# Patient Record
Sex: Male | Born: 1965 | Race: Black or African American | Hispanic: No | Smoking: Former smoker
Health system: Southern US, Community
[De-identification: ages and names within clinical notes are randomized; demographics above are authoritative.]

## PROBLEM LIST (undated history)

## (undated) ENCOUNTER — Emergency Department (HOSPITAL_COMMUNITY): Admission: EM | Source: Home / Self Care

## (undated) DIAGNOSIS — F419 Anxiety disorder, unspecified: Secondary | ICD-10-CM

## (undated) DIAGNOSIS — E7800 Pure hypercholesterolemia, unspecified: Secondary | ICD-10-CM

## (undated) DIAGNOSIS — T7840XA Allergy, unspecified, initial encounter: Secondary | ICD-10-CM

## (undated) DIAGNOSIS — F1910 Other psychoactive substance abuse, uncomplicated: Secondary | ICD-10-CM

## (undated) DIAGNOSIS — T300 Burn of unspecified body region, unspecified degree: Secondary | ICD-10-CM

## (undated) DIAGNOSIS — I10 Essential (primary) hypertension: Secondary | ICD-10-CM

## (undated) DIAGNOSIS — G4730 Sleep apnea, unspecified: Secondary | ICD-10-CM

## (undated) DIAGNOSIS — S069XAA Unspecified intracranial injury with loss of consciousness status unknown, initial encounter: Secondary | ICD-10-CM

## (undated) DIAGNOSIS — K219 Gastro-esophageal reflux disease without esophagitis: Secondary | ICD-10-CM

## (undated) DIAGNOSIS — R569 Unspecified convulsions: Secondary | ICD-10-CM

## (undated) HISTORY — DX: Sleep apnea, unspecified: G47.30

## (undated) HISTORY — DX: Allergy, unspecified, initial encounter: T78.40XA

## (undated) HISTORY — DX: Gastro-esophageal reflux disease without esophagitis: K21.9

## (undated) HISTORY — DX: Anxiety disorder, unspecified: F41.9

## (undated) HISTORY — PX: COLONOSCOPY: SHX174

## (undated) HISTORY — PX: OTHER SURGICAL HISTORY: SHX169

## (undated) HISTORY — DX: Other psychoactive substance abuse, uncomplicated: F19.10

---

## 2011-11-03 ENCOUNTER — Emergency Department (HOSPITAL_BASED_OUTPATIENT_CLINIC_OR_DEPARTMENT_OTHER)
Admission: EM | Admit: 2011-11-03 | Discharge: 2011-11-03 | Disposition: A | Discharge status: Home / Self Care | Payer: Self-pay | Attending: Emergency Medicine | Admitting: Emergency Medicine

## 2011-11-03 ENCOUNTER — Encounter (HOSPITAL_BASED_OUTPATIENT_CLINIC_OR_DEPARTMENT_OTHER): Payer: Self-pay

## 2011-11-03 DIAGNOSIS — X503XXA Overexertion from repetitive movements, initial encounter: Secondary | ICD-10-CM | POA: Insufficient documentation

## 2011-11-03 DIAGNOSIS — M545 Low back pain, unspecified: Secondary | ICD-10-CM | POA: Insufficient documentation

## 2011-11-03 DIAGNOSIS — S39012A Strain of muscle, fascia and tendon of lower back, initial encounter: Secondary | ICD-10-CM

## 2011-11-03 DIAGNOSIS — S335XXA Sprain of ligaments of lumbar spine, initial encounter: Secondary | ICD-10-CM | POA: Insufficient documentation

## 2011-11-03 HISTORY — DX: Burn of unspecified body region, unspecified degree: T30.0

## 2011-11-03 MED ORDER — KETOROLAC TROMETHAMINE 60 MG/2ML IM SOLN
60.0000 mg | Freq: Once | INTRAMUSCULAR | Status: AC
  Administered 2011-11-03: 60 mg via INTRAMUSCULAR
  Filled 2011-11-03: qty 2

## 2011-11-03 MED ORDER — IBUPROFEN 800 MG PO TABS: 800.0000 mg | ORAL_TABLET | Freq: Three times a day (TID) | ORAL | Status: AC

## 2011-11-03 MED ORDER — OXYCODONE-ACETAMINOPHEN 5-325 MG PO TABS: 2.0000 | ORAL_TABLET | ORAL | Status: AC | PRN

## 2011-11-03 MED ORDER — OXYCODONE-ACETAMINOPHEN 5-325 MG PO TABS
1.0000 | ORAL_TABLET | Freq: Once | ORAL | Status: AC
  Administered 2011-11-03: 1 via ORAL
  Filled 2011-11-03: qty 1

## 2011-11-03 MED ORDER — DIAZEPAM 5 MG PO TABS: 5.0000 mg | ORAL_TABLET | Freq: Two times a day (BID) | ORAL | Status: AC
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
## 2011-11-03 NOTE — ED Notes (Signed)
Pt states that he has back pain that extends across his back.  Pt states that he has been taking muscle relaxers and vicodin at home with no relief.  No sensation or circ compromise.

## 2011-11-04 NOTE — ED Provider Notes (Signed)
Medical screening examination/treatment/procedure(s) were performed by non-physician practitioner and as supervising physician I was immediately available for consultation/collaboration.   Gwyneth Sprout, MD 11/04/11 [phone]

## 2013-03-22 ENCOUNTER — Encounter (HOSPITAL_BASED_OUTPATIENT_CLINIC_OR_DEPARTMENT_OTHER): Payer: Self-pay | Admitting: *Deleted

## 2013-03-22 ENCOUNTER — Emergency Department (HOSPITAL_BASED_OUTPATIENT_CLINIC_OR_DEPARTMENT_OTHER)
Admission: EM | Admit: 2013-03-22 | Discharge: 2013-03-22 | Disposition: A | Discharge status: Home / Self Care | Payer: Self-pay | Attending: Emergency Medicine | Admitting: Emergency Medicine

## 2013-03-22 DIAGNOSIS — Z87891 Personal history of nicotine dependence: Secondary | ICD-10-CM | POA: Insufficient documentation

## 2013-03-22 DIAGNOSIS — L509 Urticaria, unspecified: Secondary | ICD-10-CM | POA: Insufficient documentation

## 2013-03-22 NOTE — ED Notes (Signed)
np at bedside
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
## 2013-03-22 NOTE — ED Notes (Signed)
Pt c/o rash to entire body x 1 week 

## 2013-03-22 NOTE — ED Provider Notes (Signed)
Medical screening examination/treatment/procedure(s) were performed by non-physician practitioner and as supervising physician I was immediately available for consultation/collaboration.   Jarone Ostergaard, MD 03/22/13 1935 

## 2013-06-05 ENCOUNTER — Encounter (HOSPITAL_BASED_OUTPATIENT_CLINIC_OR_DEPARTMENT_OTHER): Payer: Self-pay | Admitting: Emergency Medicine

## 2013-06-05 ENCOUNTER — Emergency Department (HOSPITAL_BASED_OUTPATIENT_CLINIC_OR_DEPARTMENT_OTHER)
Admission: EM | Admit: 2013-06-05 | Discharge: 2013-06-05 | Disposition: A | Discharge status: Home / Self Care | Payer: Self-pay | Attending: Emergency Medicine | Admitting: Emergency Medicine

## 2013-06-05 DIAGNOSIS — Z87828 Personal history of other (healed) physical injury and trauma: Secondary | ICD-10-CM | POA: Insufficient documentation

## 2013-06-05 DIAGNOSIS — Z87891 Personal history of nicotine dependence: Secondary | ICD-10-CM | POA: Insufficient documentation

## 2013-06-05 DIAGNOSIS — J029 Acute pharyngitis, unspecified: Secondary | ICD-10-CM | POA: Insufficient documentation

## 2013-06-05 NOTE — ED Notes (Signed)
Nasal congestion, sore throat and difficulty breathing since this am.  No fever.  No respiratory distress noted.
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
## 2016-02-25 ENCOUNTER — Emergency Department (HOSPITAL_BASED_OUTPATIENT_CLINIC_OR_DEPARTMENT_OTHER)
Admission: EM | Admit: 2016-02-25 | Discharge: 2016-02-25 | Disposition: A | Discharge status: Home / Self Care | Payer: PRIVATE HEALTH INSURANCE | Attending: Emergency Medicine | Admitting: Emergency Medicine

## 2016-02-25 ENCOUNTER — Encounter (HOSPITAL_BASED_OUTPATIENT_CLINIC_OR_DEPARTMENT_OTHER): Payer: Self-pay | Admitting: Emergency Medicine

## 2016-02-25 DIAGNOSIS — Z79899 Other long term (current) drug therapy: Secondary | ICD-10-CM | POA: Diagnosis not present

## 2016-02-25 DIAGNOSIS — H6123 Impacted cerumen, bilateral: Secondary | ICD-10-CM | POA: Insufficient documentation

## 2016-02-25 DIAGNOSIS — Z87891 Personal history of nicotine dependence: Secondary | ICD-10-CM | POA: Insufficient documentation

## 2016-02-25 DIAGNOSIS — H938X3 Other specified disorders of ear, bilateral: Secondary | ICD-10-CM | POA: Diagnosis present

## 2016-02-25 HISTORY — DX: Unspecified convulsions: R56.9

## 2016-02-25 NOTE — Discharge Instructions (Signed)
Follow up with your primary care provider in regard's to today's visit. See attached resource guide for help finding a primary physician. Return to ER for new or worsening symptoms, any additional concerns.
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
## 2016-02-25 NOTE — ED Triage Notes (Signed)
Patient states that his right ear has been "stopped up" x 5 months. Reports that now his left ear is feeling the same

## 2016-03-13 ENCOUNTER — Other Ambulatory Visit: Payer: Self-pay | Admitting: Occupational Medicine

## 2016-03-13 ENCOUNTER — Ambulatory Visit: Payer: Self-pay

## 2016-03-13 DIAGNOSIS — Z0000 Encounter for general adult medical examination without abnormal findings: Secondary | ICD-10-CM

## 2016-04-25 ENCOUNTER — Encounter (HOSPITAL_BASED_OUTPATIENT_CLINIC_OR_DEPARTMENT_OTHER): Payer: Self-pay | Admitting: *Deleted

## 2016-04-25 ENCOUNTER — Emergency Department (HOSPITAL_BASED_OUTPATIENT_CLINIC_OR_DEPARTMENT_OTHER)
Admission: EM | Admit: 2016-04-25 | Discharge: 2016-04-25 | Disposition: A | Discharge status: Home / Self Care | Payer: PRIVATE HEALTH INSURANCE | Attending: Emergency Medicine | Admitting: Emergency Medicine

## 2016-04-25 DIAGNOSIS — Z87891 Personal history of nicotine dependence: Secondary | ICD-10-CM | POA: Insufficient documentation

## 2016-04-25 DIAGNOSIS — R197 Diarrhea, unspecified: Secondary | ICD-10-CM | POA: Insufficient documentation

## 2016-04-25 LAB — COMPREHENSIVE METABOLIC PANEL
ALT: 35 U/L (ref 17–63)
ANION GAP: 6 (ref 5–15)
AST: 31 U/L (ref 15–41)
Albumin: 4.2 g/dL (ref 3.5–5.0)
Alkaline Phosphatase: 45 U/L (ref 38–126)
BUN: 14 mg/dL (ref 6–20)
CHLORIDE: 103 mmol/L (ref 101–111)
CO2: 29 mmol/L (ref 22–32)
Calcium: 9.7 mg/dL (ref 8.9–10.3)
Creatinine, Ser: 1.09 mg/dL (ref 0.61–1.24)
GFR calc non Af Amer: >60 mL/min (ref >60)
Glucose, Bld: 124 mg/dL — ABNORMAL HIGH (ref 65–99)
POTASSIUM: 3.5 mmol/L (ref 3.5–5.1)
SODIUM: 138 mmol/L (ref 135–145)
Total Bilirubin: 0.6 mg/dL (ref 0.3–1.2)
Total Protein: 7.3 g/dL (ref 6.5–8.1)

## 2016-04-25 LAB — LIPASE, BLOOD: LIPASE: 21 U/L (ref 11–51)

## 2016-04-25 LAB — CBC WITH DIFFERENTIAL/PLATELET
BASOS ABS: 0 10*3/uL (ref 0.0–0.1)
Basophils Relative: 0 %
Eosinophils Absolute: 0.1 10*3/uL (ref 0.0–0.7)
Eosinophils Relative: 2 %
HCT: 45.2 % (ref 39.0–52.0)
HEMOGLOBIN: 15.5 g/dL (ref 13.0–17.0)
LYMPHS ABS: 2.7 10*3/uL (ref 0.7–4.0)
LYMPHS PCT: 43 %
MCH: 27.5 pg (ref 26.0–34.0)
MCHC: 34.3 g/dL (ref 30.0–36.0)
MCV: 80.1 fL (ref 78.0–100.0)
Monocytes Absolute: 0.5 10*3/uL (ref 0.1–1.0)
Monocytes Relative: 8 %
NEUTROS PCT: 47 %
Neutro Abs: 2.9 10*3/uL (ref 1.7–7.7)
Platelets: 283 10*3/uL (ref 150–400)
RBC: 5.64 MIL/uL (ref 4.22–5.81)
RDW: 14.2 % (ref 11.5–15.5)
WBC: 6.2 10*3/uL (ref 4.0–10.5)

## 2016-04-25 LAB — MAGNESIUM: MAGNESIUM: 1.8 mg/dL (ref 1.7–2.4)

## 2016-04-25 MED ORDER — LOPERAMIDE HCL 2 MG PO CAPS
4.0000 mg | ORAL_CAPSULE | Freq: Once | ORAL | Status: AC
  Administered 2016-04-25: 4 mg via ORAL
  Filled 2016-04-25: qty 2

## 2016-04-25 MED ORDER — DICYCLOMINE HCL 10 MG PO CAPS
10.0000 mg | ORAL_CAPSULE | Freq: Once | ORAL | Status: AC
  Administered 2016-04-25: 10 mg via ORAL
  Filled 2016-04-25: qty 1

## 2016-04-25 MED ORDER — DICYCLOMINE HCL 10 MG PO CAPS: 20.0000 mg | ORAL_CAPSULE | Freq: Four times a day (QID) | ORAL | 0 refills | Status: DC | PRN

## 2016-04-25 MED ORDER — LOPERAMIDE HCL 2 MG PO CAPS: 2.0000 mg | ORAL_CAPSULE | Freq: Four times a day (QID) | ORAL | 0 refills | Status: DC | PRN

## 2016-04-25 MED FILL — DICYCLOMINE 20 MG TABLET: 20 | 2 days supply | Qty: 10 | Fill #0

## 2016-04-25 MED FILL — SM ANTI-DIARRHEAL 2 MG CAPL: 2 | 12 days supply | Qty: 48 | Fill #0

## 2016-04-25 NOTE — ED Triage Notes (Signed)
Pt c/o diarrhea only x 2 days
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
## 2016-04-25 NOTE — Discharge Instructions (Signed)
Please follow with your primary care doctor in the next 2 days for a check-up. They must obtain records for further management.  ° °Do not hesitate to return to the Emergency Department for any new, worsening or concerning symptoms.  ° °

## 2016-07-08 DIAGNOSIS — N529 Male erectile dysfunction, unspecified: Secondary | ICD-10-CM | POA: Insufficient documentation

## 2016-09-29 ENCOUNTER — Emergency Department (HOSPITAL_BASED_OUTPATIENT_CLINIC_OR_DEPARTMENT_OTHER): Payer: Managed Care, Other (non HMO)

## 2016-09-29 ENCOUNTER — Encounter (HOSPITAL_BASED_OUTPATIENT_CLINIC_OR_DEPARTMENT_OTHER): Payer: Self-pay | Admitting: Emergency Medicine

## 2016-09-29 ENCOUNTER — Emergency Department (HOSPITAL_BASED_OUTPATIENT_CLINIC_OR_DEPARTMENT_OTHER)
Admission: EM | Admit: 2016-09-29 | Discharge: 2016-09-29 | Disposition: A | Discharge status: Home / Self Care | Payer: Managed Care, Other (non HMO) | Attending: Emergency Medicine | Admitting: Emergency Medicine

## 2016-09-29 DIAGNOSIS — Y929 Unspecified place or not applicable: Secondary | ICD-10-CM | POA: Insufficient documentation

## 2016-09-29 DIAGNOSIS — S29011A Strain of muscle and tendon of front wall of thorax, initial encounter: Secondary | ICD-10-CM | POA: Diagnosis not present

## 2016-09-29 DIAGNOSIS — Z87891 Personal history of nicotine dependence: Secondary | ICD-10-CM | POA: Diagnosis not present

## 2016-09-29 DIAGNOSIS — R0789 Other chest pain: Secondary | ICD-10-CM

## 2016-09-29 DIAGNOSIS — S299XXA Unspecified injury of thorax, initial encounter: Secondary | ICD-10-CM | POA: Diagnosis present

## 2016-09-29 DIAGNOSIS — X501XXA Overexertion from prolonged static or awkward postures, initial encounter: Secondary | ICD-10-CM | POA: Diagnosis not present

## 2016-09-29 DIAGNOSIS — Y999 Unspecified external cause status: Secondary | ICD-10-CM | POA: Insufficient documentation

## 2016-09-29 DIAGNOSIS — I10 Essential (primary) hypertension: Secondary | ICD-10-CM | POA: Insufficient documentation

## 2016-09-29 DIAGNOSIS — Z79899 Other long term (current) drug therapy: Secondary | ICD-10-CM | POA: Insufficient documentation

## 2016-09-29 DIAGNOSIS — Y9389 Activity, other specified: Secondary | ICD-10-CM | POA: Diagnosis not present

## 2016-09-29 HISTORY — DX: Essential (primary) hypertension: I10

## 2016-09-29 HISTORY — DX: Pure hypercholesterolemia, unspecified: E78.00

## 2016-09-29 LAB — CBC
HCT: 42.6 % (ref 39.0–52.0)
Hemoglobin: 14.7 g/dL (ref 13.0–17.0)
MCH: 27.1 pg (ref 26.0–34.0)
MCHC: 34.5 g/dL (ref 30.0–36.0)
MCV: 78.6 fL (ref 78.0–100.0)
PLATELETS: 325 10*3/uL (ref 150–400)
RBC: 5.42 MIL/uL (ref 4.22–5.81)
RDW: 13.6 % (ref 11.5–15.5)
WBC: 5.3 10*3/uL (ref 4.0–10.5)

## 2016-09-29 LAB — COMPREHENSIVE METABOLIC PANEL
ALBUMIN: 3.9 g/dL (ref 3.5–5.0)
ALT: 22 U/L (ref 17–63)
AST: 18 U/L (ref 15–41)
Alkaline Phosphatase: 54 U/L (ref 38–126)
Anion gap: 6 (ref 5–15)
BUN: 12 mg/dL (ref 6–20)
CHLORIDE: 104 mmol/L (ref 101–111)
CO2: 28 mmol/L (ref 22–32)
CREATININE: 0.82 mg/dL (ref 0.61–1.24)
Calcium: 9.1 mg/dL (ref 8.9–10.3)
GFR calc Af Amer: >60 mL/min (ref >60)
GLUCOSE: 84 mg/dL (ref 65–99)
POTASSIUM: 3.8 mmol/L (ref 3.5–5.1)
SODIUM: 138 mmol/L (ref 135–145)
Total Bilirubin: 0.6 mg/dL (ref 0.3–1.2)
Total Protein: 7.3 g/dL (ref 6.5–8.1)

## 2016-09-29 LAB — TROPONIN I

## 2016-09-29 MED ORDER — IBUPROFEN 800 MG PO TABS: 800.0000 mg | ORAL_TABLET | Freq: Four times a day (QID) | ORAL | 0 refills | Status: DC | PRN

## 2016-09-29 NOTE — ED Notes (Signed)
Pt ambulatory without assistance.  

## 2016-09-29 NOTE — ED Notes (Signed)
Pt on cardiac monitor and automatic VS 

## 2016-09-29 NOTE — ED Triage Notes (Signed)
Chest pain since Friday, non radiating, denies N/V, SOB. Describes it as a raw feeling.

## 2016-09-29 NOTE — ED Provider Notes (Signed)
[facility] Provider Note   CSN: [id number] Arrival date & time: 09/29/16  1441   By signing my name below, I, Soijett Blue, attest that this documentation has been prepared under the direction and in the presence of Leo Grosser, MD. Electronically Signed: Soijett Blue, ED Scribe. 09/29/16. 3:52 PM.  History   Chief Complaint Chief Complaint  Patient presents with  . Chest Pain    HPI Timothy Fuller is a 51 y.o. male with a PMHx of HTN, high cholesterol, who presents to the Emergency Department complaining of intermittent, non-radiating, sharp, left sided CP onset 2 days ago. He reports that certain movements, ambulation, and exertion exacerbate his left sided CP. Pt notes that he hasn't had an increase in his activity levels prior to the onset of his symptoms. Pt notes that he had a cousin to have a MI before age 52, but denies immediate family members to have a MI before age 37. He denies SOB, diaphoresis, nausea, and any other symptoms. Denies PMHx of DM or cardiac issues.    The history is provided by the patient. No language interpreter was used.    Past Medical History:  Diagnosis Date  . Burn   . High cholesterol   . Hypertension   . Seizures ([place])     There are no active problems to display for this patient.   Past Surgical History:  Procedure Laterality Date  . SKIN GRAFT         Home Medications    Prior to Admission medications   Medication Sig Start Date End Date Taking? Authorizing Provider  amLODipine (NORVASC) 5 MG tablet Take 5 mg by mouth daily.   Yes Historical Provider, MD  hydrOXYzine (ATARAX/VISTARIL) 25 MG tablet Take 25 mg by mouth 3 (three) times daily as needed.   Yes Historical Provider, MD  sertraline (ZOLOFT) 25 MG tablet Take 25 mg by mouth daily.   Yes Historical Provider, MD  dicyclomine (BENTYL) 10 MG capsule Take 2 capsules (20 mg total) by mouth 4 (four) times daily as needed for spasms. 04/25/16   Nicole Pisciotta, PA-C    loperamide (IMODIUM) 2 MG capsule Take 1 capsule (2 mg total) by mouth 4 (four) times daily as needed for diarrhea or loose stools. 04/25/16   Elmyra Ricks Pisciotta, PA-C    Family History No family history on file.  Social History Social History  Substance Use Topics  . Smoking status: Former Smoker    Packs/day: 1.00  . Smokeless tobacco: Never Used  . Alcohol use No     Allergies   Patient has no known allergies.   Review of Systems Review of Systems  All other systems reviewed and are negative.    Physical Exam Updated Vital Signs BP 119/75 (BP Location: Left Arm)   Pulse 69   Temp 98.2 F (36.8 C) (Oral)   Resp 16   Ht 6\' 1"  (1.854 m)   Wt 217 lb (98.4 kg)   SpO2 99%   BMI 28.63 kg/m   Physical Exam  Constitutional: He is oriented to person, place, and time. He appears well-developed and well-nourished. No distress.  HENT:  Head: Normocephalic and atraumatic.  Nose: Nose normal.  Eyes: Conjunctivae are normal.  Neck: Neck supple. No tracheal deviation present.  Cardiovascular: Normal rate, regular rhythm and normal heart sounds.  Exam reveals no gallop and no friction rub.   No murmur heard. Pulmonary/Chest: Effort normal and breath sounds normal. No respiratory distress. He has no wheezes.  He has no rales.  Left pectoralis tenderness with left arm flexion.   Abdominal: Soft. He exhibits no distension.  Neurological: He is alert and oriented to person, place, and time.  Skin: Skin is warm and dry.  Psychiatric: He has a normal mood and affect.  Nursing note and vitals reviewed.    ED Treatments / Results  DIAGNOSTIC STUDIES: Oxygen Saturation is 99% on RA, nl by my interpretation.    COORDINATION OF CARE: 3:51 PM Discussed treatment plan with pt at bedside which includes labs, EKG, CXR, and pt agreed to plan.   Labs (all labs ordered are listed, but only abnormal results are displayed) Labs Reviewed  CBC  TROPONIN I  COMPREHENSIVE METABOLIC  PANEL    EKG  EKG Interpretation  Date/Time:  Sunday September 29 2016 14:46:24 EDT Ventricular Rate:  70 PR Interval:  190 QRS Duration: 94 QT Interval:  378 QTC Calculation: 408 R Axis:   79 Text Interpretation:  Normal sinus rhythm ST elevation, consider early repolarization Borderline ECG Confirmed by Rees, Liz ([id number]) on 09/29/2016 2:48:36 PM       Radiology No results found.  Procedures Procedures (including critical care time)  Medications Ordered in ED Medications - No data to display   Initial Impression / Assessment and Plan / ED Course  I have reviewed the triage vital signs and the nursing notes.  Pertinent labs & imaging results that were available during my care of the patient were reviewed by me and considered in my medical decision making (see chart for details).     50  year old male presents with highly atypical left-sided chest pain is reproducible with certain movements and palpation over the left anterior chest. He has taken nothing for the symptoms. He is well-appearing and has a few risk factors of hypertension, hyperlipidemia and some early repolarization changes which are likely normal variant given his demographics. Heart score is 3. Troponin is negative despite 2 days of ongoing intermittent pain. Patient was recommended to take short course of scheduled NSAIDs and engage in early mobility as definitive treatment.   Final Clinical Impressions(s) / ED Diagnoses   Final diagnoses:  Strain of left pectoralis muscle, initial encounter  Chest wall pain    New Prescriptions New Prescriptions   IBUPROFEN (ADVIL,MOTRIN) 800 MG TABLET    Take 1 tablet (800 mg total) by mouth every 6 (six) hours as needed for moderate pain.   I personally performed the services described in this documentation, which was scribed in my presence. The recorded information has been reviewed and is accurate.     Leo Grosser, MD 09/29/16 [phone]

## 2017-01-09 DIAGNOSIS — F419 Anxiety disorder, unspecified: Secondary | ICD-10-CM | POA: Insufficient documentation

## 2017-01-09 DIAGNOSIS — F32A Depression, unspecified: Secondary | ICD-10-CM | POA: Insufficient documentation

## 2017-02-09 ENCOUNTER — Encounter (HOSPITAL_BASED_OUTPATIENT_CLINIC_OR_DEPARTMENT_OTHER): Payer: Self-pay | Admitting: *Deleted

## 2017-02-09 ENCOUNTER — Emergency Department (HOSPITAL_BASED_OUTPATIENT_CLINIC_OR_DEPARTMENT_OTHER): Payer: Managed Care, Other (non HMO)

## 2017-02-09 ENCOUNTER — Emergency Department (HOSPITAL_BASED_OUTPATIENT_CLINIC_OR_DEPARTMENT_OTHER)
Admission: EM | Admit: 2017-02-09 | Discharge: 2017-02-09 | Disposition: A | Discharge status: Home / Self Care | Payer: Managed Care, Other (non HMO) | Attending: Emergency Medicine | Admitting: Emergency Medicine

## 2017-02-09 DIAGNOSIS — Z79899 Other long term (current) drug therapy: Secondary | ICD-10-CM | POA: Diagnosis not present

## 2017-02-09 DIAGNOSIS — S63502A Unspecified sprain of left wrist, initial encounter: Secondary | ICD-10-CM

## 2017-02-09 DIAGNOSIS — Z87891 Personal history of nicotine dependence: Secondary | ICD-10-CM | POA: Diagnosis not present

## 2017-02-09 DIAGNOSIS — Y999 Unspecified external cause status: Secondary | ICD-10-CM | POA: Diagnosis not present

## 2017-02-09 DIAGNOSIS — Y939 Activity, unspecified: Secondary | ICD-10-CM | POA: Insufficient documentation

## 2017-02-09 DIAGNOSIS — S6992XA Unspecified injury of left wrist, hand and finger(s), initial encounter: Secondary | ICD-10-CM | POA: Diagnosis present

## 2017-02-09 DIAGNOSIS — I10 Essential (primary) hypertension: Secondary | ICD-10-CM | POA: Insufficient documentation

## 2017-02-09 DIAGNOSIS — S63095A Other dislocation of left wrist and hand, initial encounter: Secondary | ICD-10-CM

## 2017-02-09 DIAGNOSIS — Y929 Unspecified place or not applicable: Secondary | ICD-10-CM | POA: Diagnosis not present

## 2017-02-09 DIAGNOSIS — W19XXXA Unspecified fall, initial encounter: Secondary | ICD-10-CM | POA: Diagnosis not present

## 2017-02-09 NOTE — ED Notes (Signed)
Patient transported to X-ray 

## 2017-02-09 NOTE — ED Triage Notes (Signed)
Pt reports yesterday he tripped and broke his fall with both hands. Presents today with L wrist pain. Denies numbness/tingling in fingers.

## 2017-02-09 NOTE — Discharge Instructions (Signed)

## 2017-02-09 NOTE — ED Provider Notes (Signed)
[facility] Provider Note   CSN: [id number] Arrival date & time: 02/09/17  0865     History   Chief Complaint Chief Complaint  Patient presents with  . Wrist Injury    HPI Timothy Fuller is a 51 y.o. male who presents with left wrist pain after he suffered a mechanical fall yesterday and broke his fall with his hands (Timothy Fuller mechanism).   He denies any numbness or tingling, no pain in right upper extremity, left elbow/shoulder. Did not strike his head, did not pass out.  HPI  Past Medical History:  Diagnosis Date  . Burn   . High cholesterol   . Hypertension   . Seizures (Timothy Fuller)     There are no active problems to display for this patient.   Past Surgical History:  Procedure Laterality Date  . SKIN GRAFT         Home Medications    Prior to Admission medications   Medication Sig Start Date End Date Taking? Authorizing Provider  ALPRAZolam (XANAX PO) Take by mouth.   Yes [provider]  amLODipine (NORVASC) 5 MG tablet Take 5 mg by mouth daily.   Yes [provider]  ibuprofen (ADVIL,MOTRIN) 800 MG tablet Take 1 tablet (800 mg total) by mouth every 6 (six) hours as needed for moderate pain. 09/29/16  Yes Timothy Grosser, MD  dicyclomine (BENTYL) 10 MG capsule Take 2 capsules (20 mg total) by mouth 4 (four) times daily as needed for spasms. 04/25/16   Fuller, Timothy Ricks, PA-C  hydrOXYzine (ATARAX/VISTARIL) 25 MG tablet Take 25 mg by mouth 3 (three) times daily as needed.    [provider]  loperamide (IMODIUM) 2 MG capsule Take 1 capsule (2 mg total) by mouth 4 (four) times daily as needed for diarrhea or loose stools. 04/25/16   Fuller, Timothy Ricks, PA-C  sertraline (ZOLOFT) 25 MG tablet Take 25 mg by mouth daily.    [provider]    Family History No family history on file.  Social History Social History  Substance Use Topics  . Smoking status: Former Smoker    Packs/day: 1.00  . Smokeless tobacco: Never Used  .  Alcohol use No     Allergies   Patient has no known allergies.   Review of Systems Review of Systems  Musculoskeletal: Positive for arthralgias. Negative for joint swelling and myalgias.  Neurological: Negative for numbness and headaches.     Physical Exam Updated Vital Signs BP (!) 145/82 (BP Location: Right Arm)   Pulse 70   Temp 97.8 F (36.6 C) (Oral)   Resp 14   Ht 6\' 1"  (1.854 m)   Wt 103.4 kg (228 lb)   SpO2 97%   BMI 30.08 kg/m   Physical Exam  Constitutional: He appears well-developed and well-nourished.  HENT:  Head: Normocephalic and atraumatic.  Cardiovascular: Intact distal pulses.   Musculoskeletal:  LUE: Full active range of motion, however is painful. Grip strength and sensation are intact. Good cap refill to fingers. No scaphoid/anatomic snuffbox tenderness.  Neurological: No sensory deficit.  Skin: Skin is warm and dry. He is not diaphoretic.  Psychiatric: He has a normal mood and affect. His behavior is normal.  Nursing note and vitals reviewed.    ED Treatments / Results  Labs (all labs ordered are listed, but only abnormal results are displayed) Labs Reviewed - No data to display  EKG  EKG Interpretation None       Radiology Dg Wrist Complete Left  Result Date: 02/09/2017 CLINICAL DATA:  Fall yesterday with wrist pain, initial encounter EXAM: LEFT WRIST - COMPLETE 3+ VIEW COMPARISON:  None. FINDINGS: Mild widening of the scapholunate space is noted which may be related to ligamentous injury. No acute fracture or dislocation is seen. No soft tissue abnormality is noted. IMPRESSION: Mild widening of the scapholunate space which may be related to ligamentous injury. No acute bony abnormality is seen. Electronically Signed   By: Timothy Fuller M.D.   On: 02/09/2017 09:16    Procedures Procedures (including critical care time)  Medications Ordered in ED Medications - No data to display   Initial Impression / Assessment and Plan / ED  Course  I have reviewed the triage vital signs and the nursing notes.  Pertinent labs & imaging results that were available during my care of the patient were reviewed by me and considered in my medical decision making (see chart for details).    Timothy Fuller Presents with left wrist  pain after a mechanical fall with a [place] type injury that occurred yesterday consistent with a wrist sprain/strain.  The affected wrist has mild edema and is tender on the posterior aspect.  X-rays were obtained with mild scapholunate widening, possibly suggestive of ligamentous injury.  The skin is intact to wrist/hand.  The hand is warm and well perfused with intact sensation.  Motor function is limited secondary to pain.  Patient given instructions for OTC pain medication.  Patient advised to follow up with hand surgery for additional evaluation. He was given a removable wrist splint.  Patient was given the option to ask questions, all of which were answered to the best of my ability.  Patient is agreeable for discharge.     Final Clinical Impressions(s) / ED Diagnoses   Final diagnoses:  Sprain of left wrist, initial encounter  Fall, initial encounter  Diastasis of scapholunate joint, left, initial encounter    New Prescriptions New Prescriptions   No medications on file     Timothy Fuller 02/09/17 Timothy Fuller, [place], MD 02/19/17 [phone]

## 2017-10-30 IMAGING — CR DG WRIST COMPLETE 3+V*L*
4 series · 4 of 4 positions shown · non-contrast
Comparison: None.

CLINICAL DATA: Fall yesterday with wrist pain, initial encounter

EXAM:
LEFT WRIST - COMPLETE 3+ VIEW

[x wrist pa left]
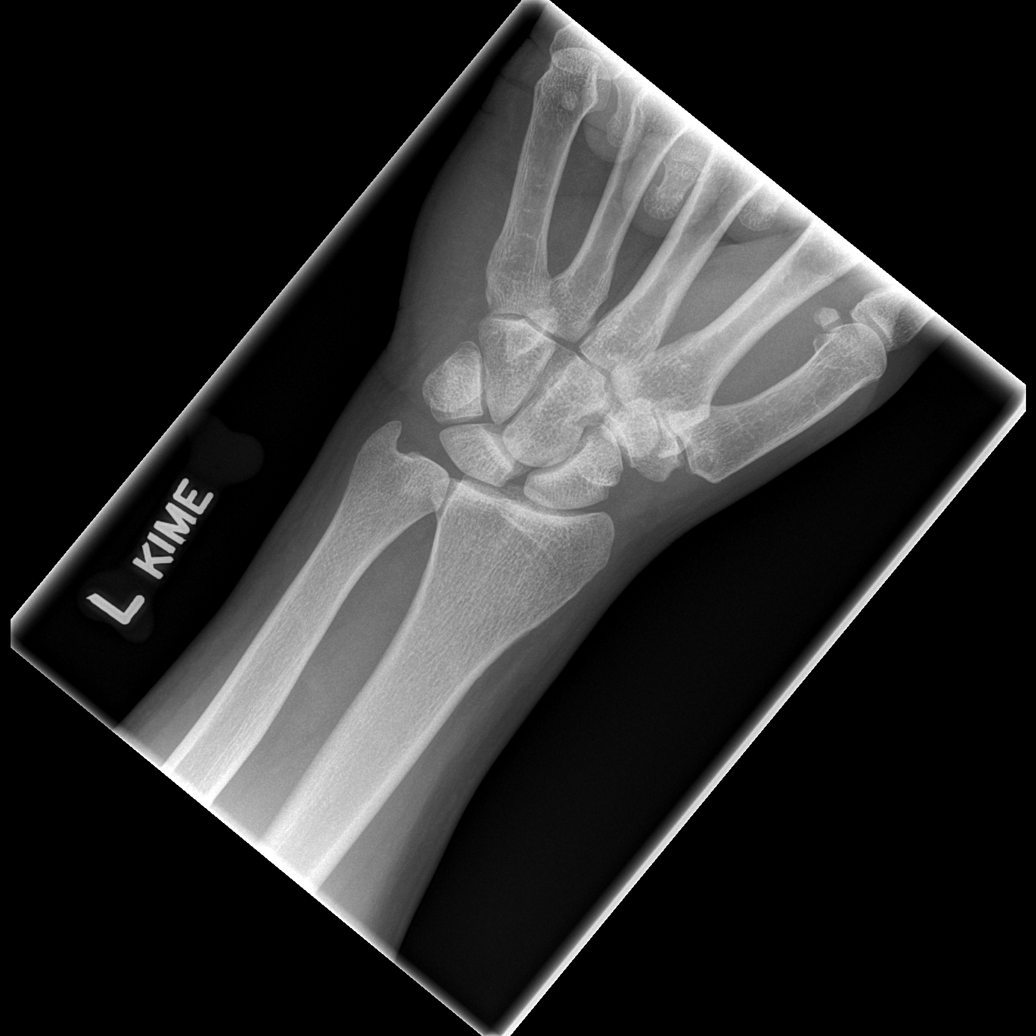

[x wrist obl left]
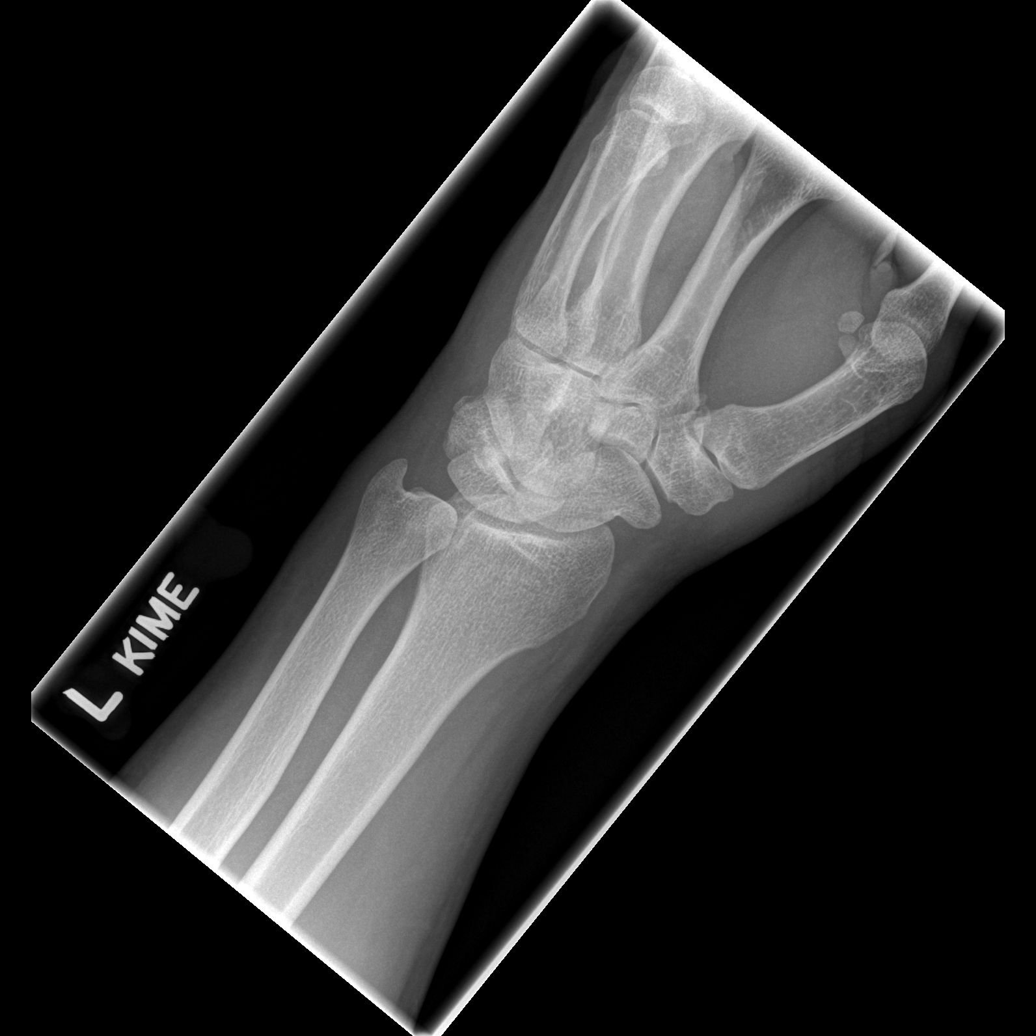

[x wrist lat left]
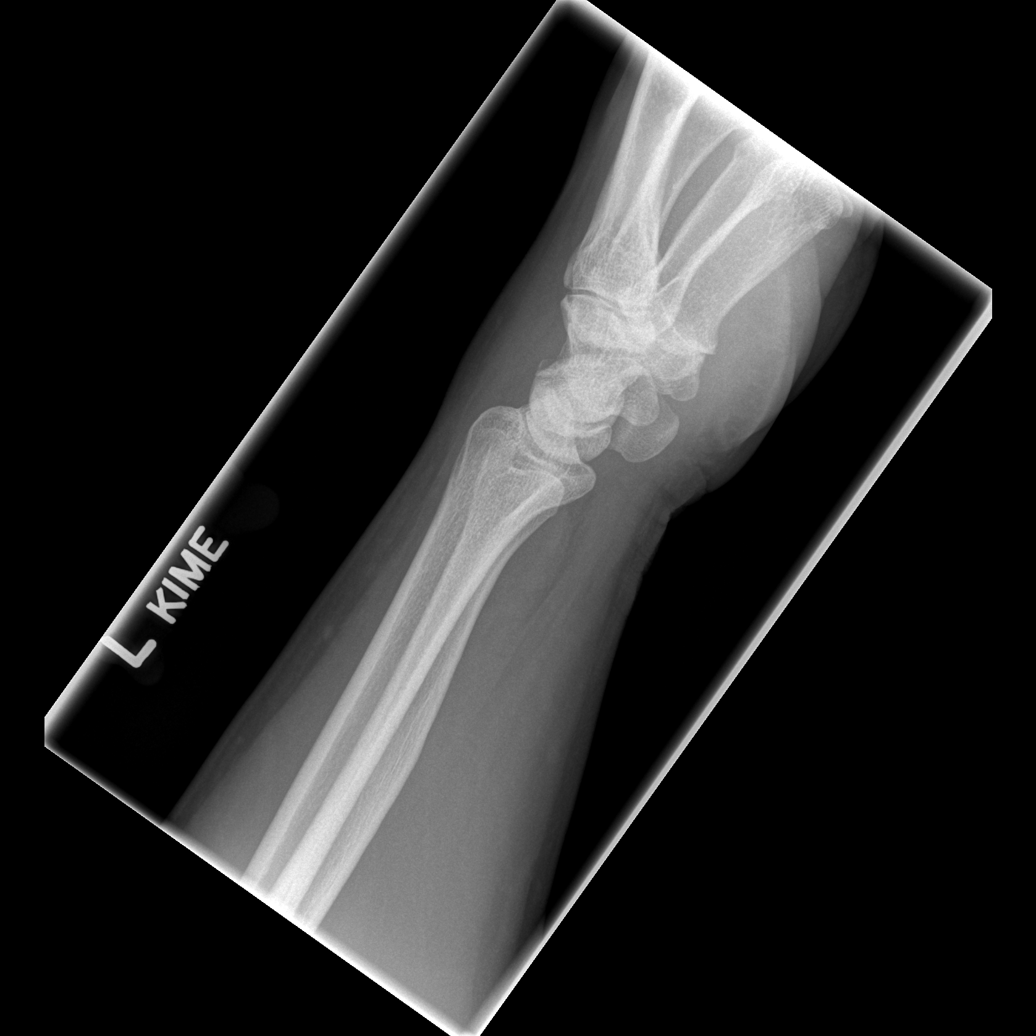

[x navicular]
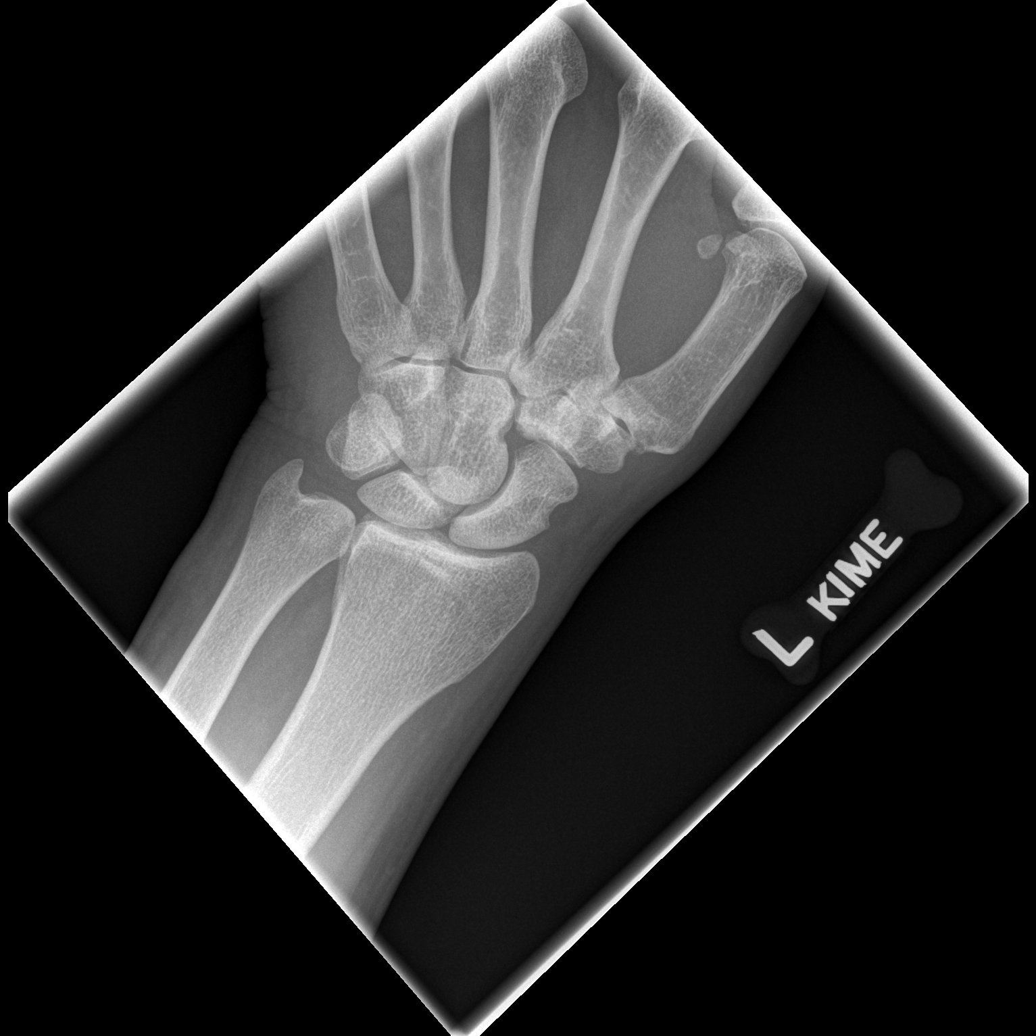

[4 of 4 positions shown; findings below may reference images not displayed]

FINDINGS: Mild widening of the scapholunate space is noted which may be
related to ligamentous injury. No acute fracture or dislocation is
seen. No soft tissue abnormality is noted.
IMPRESSION: Mild widening of the scapholunate space which may be related to
ligamentous injury. No acute bony abnormality is seen.

## 2017-12-23 ENCOUNTER — Encounter: Payer: Self-pay | Admitting: Internal Medicine

## 2017-12-23 ENCOUNTER — Ambulatory Visit: Payer: PRIVATE HEALTH INSURANCE | Admitting: Internal Medicine

## 2017-12-23 ENCOUNTER — Other Ambulatory Visit (INDEPENDENT_AMBULATORY_CARE_PROVIDER_SITE_OTHER): Payer: PRIVATE HEALTH INSURANCE

## 2017-12-23 VITALS — BP 128/84 | HR 88 | Temp 98.2°F | Ht 73.0 in | Wt 210.0 lb

## 2017-12-23 DIAGNOSIS — Z0000 Encounter for general adult medical examination without abnormal findings: Secondary | ICD-10-CM

## 2017-12-23 DIAGNOSIS — E559 Vitamin D deficiency, unspecified: Secondary | ICD-10-CM

## 2017-12-23 DIAGNOSIS — Z0001 Encounter for general adult medical examination with abnormal findings: Secondary | ICD-10-CM | POA: Insufficient documentation

## 2017-12-23 DIAGNOSIS — R5383 Other fatigue: Secondary | ICD-10-CM

## 2017-12-23 DIAGNOSIS — R350 Frequency of micturition: Secondary | ICD-10-CM | POA: Insufficient documentation

## 2017-12-23 DIAGNOSIS — R569 Unspecified convulsions: Secondary | ICD-10-CM | POA: Insufficient documentation

## 2017-12-23 DIAGNOSIS — R739 Hyperglycemia, unspecified: Secondary | ICD-10-CM | POA: Diagnosis not present

## 2017-12-23 DIAGNOSIS — G4710 Hypersomnia, unspecified: Secondary | ICD-10-CM | POA: Insufficient documentation

## 2017-12-23 DIAGNOSIS — E7800 Pure hypercholesterolemia, unspecified: Secondary | ICD-10-CM | POA: Insufficient documentation

## 2017-12-23 DIAGNOSIS — I10 Essential (primary) hypertension: Secondary | ICD-10-CM | POA: Diagnosis not present

## 2017-12-23 LAB — HEPATIC FUNCTION PANEL
ALBUMIN: 4.3 g/dL (ref 3.5–5.2)
ALT: 23 U/L (ref 0–53)
AST: 17 U/L (ref 0–37)
Alkaline Phosphatase: 52 U/L (ref 39–117)
BILIRUBIN TOTAL: 0.3 mg/dL (ref 0.2–1.2)
Bilirubin, Direct: 0.1 mg/dL (ref 0.0–0.3)
Total Protein: 7.1 g/dL (ref 6.0–8.3)

## 2017-12-23 LAB — TESTOSTERONE: Testosterone: 207.72 ng/dL — ABNORMAL LOW (ref 300.00–890.00)

## 2017-12-23 LAB — URINALYSIS, ROUTINE W REFLEX MICROSCOPIC
Bilirubin Urine: NEGATIVE
KETONES UR: NEGATIVE
Leukocytes, UA: NEGATIVE
Nitrite: NEGATIVE
Specific Gravity, Urine: 1.02 (ref 1.000–1.030)
TOTAL PROTEIN, URINE-UPE24: NEGATIVE
URINE GLUCOSE: NEGATIVE
Urobilinogen, UA: 0.2 (ref 0.0–1.0)
pH: 6.5 (ref 5.0–8.0)

## 2017-12-23 LAB — CBC WITH DIFFERENTIAL/PLATELET
BASOS PCT: 0.7 % (ref 0.0–3.0)
Basophils Absolute: 0 10*3/uL (ref 0.0–0.1)
EOS ABS: 0.1 10*3/uL (ref 0.0–0.7)
EOS PCT: 2.2 % (ref 0.0–5.0)
HEMATOCRIT: 45.5 % (ref 39.0–52.0)
HEMOGLOBIN: 15.1 g/dL (ref 13.0–17.0)
LYMPHS PCT: 27.1 % (ref 12.0–46.0)
Lymphs Abs: 1.8 10*3/uL (ref 0.7–4.0)
MCHC: 33.2 g/dL (ref 30.0–36.0)
MCV: 81.3 fl (ref 78.0–100.0)
MONO ABS: 0.5 10*3/uL (ref 0.1–1.0)
Monocytes Relative: 7.9 % (ref 3.0–12.0)
Neutro Abs: 4.2 10*3/uL (ref 1.4–7.7)
Neutrophils Relative %: 62.1 % (ref 43.0–77.0)
Platelets: 287 10*3/uL (ref 150.0–400.0)
RBC: 5.59 Mil/uL (ref 4.22–5.81)
RDW: 14.4 % (ref 11.5–15.5)
WBC: 6.7 10*3/uL (ref 4.0–10.5)

## 2017-12-23 LAB — BASIC METABOLIC PANEL
BUN: 24 mg/dL — AB (ref 6–23)
CO2: 30 mEq/L (ref 19–32)
CREATININE: 1 mg/dL (ref 0.40–1.50)
Calcium: 9.7 mg/dL (ref 8.4–10.5)
Chloride: 101 mEq/L (ref 96–112)
GFR: 101.68 mL/min (ref >60.00)
Glucose, Bld: 81 mg/dL (ref 70–99)
Potassium: 4.4 mEq/L (ref 3.5–5.1)
Sodium: 138 mEq/L (ref 135–145)

## 2017-12-23 LAB — LIPID PANEL
CHOLESTEROL: 170 mg/dL (ref 0–200)
HDL: 37.7 mg/dL — ABNORMAL LOW (ref >39.00)
NONHDL: 131.95
TRIGLYCERIDES: 206 mg/dL — AB (ref 0.0–149.0)
Total CHOL/HDL Ratio: 5
VLDL: 41.2 mg/dL — AB (ref 0.0–40.0)

## 2017-12-23 LAB — VITAMIN D 25 HYDROXY (VIT D DEFICIENCY, FRACTURES): VITD: 29.6 ng/mL — AB (ref 30.00–100.00)

## 2017-12-23 LAB — LDL CHOLESTEROL, DIRECT: LDL DIRECT: 98 mg/dL

## 2017-12-23 LAB — PSA: PSA: 0.39 ng/mL (ref 0.10–4.00)

## 2017-12-23 LAB — HEMOGLOBIN A1C: Hgb A1c MFr Bld: 6.1 % (ref 4.6–6.5)

## 2017-12-23 LAB — TSH: TSH: 1.07 u[IU]/mL (ref 0.35–4.50)

## 2017-12-23 MED ORDER — AMLODIPINE BESYLATE 5 MG PO TABS: 5.0000 mg | ORAL_TABLET | Freq: Every day | ORAL | 3 refills | Status: DC

## 2017-12-23 NOTE — Patient Instructions (Addendum)
You will be contacted regarding the referral for: colonoscopy  You had the Tdap tetanus shot today  Please continue all other medications as before, and refills have been done if requested.  Please have the pharmacy call with any other refills you may need.  Please continue your efforts at being more active, low cholesterol diet, and weight control.  You are otherwise up to date with prevention measures today.  Please keep your appointments with your specialists as you may have planned  You will be contacted regarding the referral for: Pulmonary  Please go to the LAB in the Basement (turn left off the elevator) for the tests to be done today  You will be contacted by phone if any changes need to be made immediately.  Otherwise, you will receive a letter about your results with an explanation, but please check with MyChart first.  Please remember to sign up for MyChart if you have not done so, as this will be important to you in the future with finding out test results, communicating by private email, and scheduling acute appointments online when needed.  Please return in 1 year for your yearly visit, or sooner if needed, with Lab testing done 3-5 days before

## 2017-12-23 NOTE — Assessment & Plan Note (Signed)
Ok for pulm referral, r/o osa

## 2017-12-23 NOTE — Assessment & Plan Note (Signed)

## 2017-12-23 NOTE — Progress Notes (Signed)
Subjective:    Patient ID: Timothy Fuller, male    DOB: 1966-01-31, 52 y.o.   MRN: [id number]  HPI  Here for wellness and to establish as new pt;  Overall doing ok;  Pt denies Chest pain, worsening SOB, DOE, wheezing, orthopnea, PND, worsening LE edema, palpitations, dizziness or syncope.  Pt denies neurological change such as new headache, facial or extremity weakness.  Pt denies polydipsia, polyuria, or low sugar symptoms. Pt states overall good compliance with treatment and medications, good tolerability, and has been trying to follow appropriate diet.  Pt denies worsening depressive symptoms, suicidal ideation or panic. No fever, night sweats, wt loss, loss of appetite, or other constitutional symptoms.  Pt states good ability with ADL's, has low fall risk, home safety reviewed and adequate, no other significant changes in hearing or vision, and only occasionally active with exercise.  Denies urinary symptoms such as dysuria, urgency, flank pain, hematuria or n/v, fever, chills, but does have remarkable urinary frequency every day, though also drinks quite a bit of fluids.  BR visits are every 1-2 hours.  No hx of DI or DM.   Also wife mentions pt clearly has marked gasping choking and difficulty breathing with sleeping of which he is unaware, though does not endorse daytime fatigue, though also not going to the gym recently Past Medical History:  Diagnosis Date  . Burn   . High cholesterol   . Hypertension   . Seizures ([place])    52yo last siezure   History reviewed. No pertinent surgical history.  reports that he has quit smoking. He smoked 1.00 pack per day. He has never used smokeless tobacco. He reports that he does not drink alcohol or use drugs. family history includes Alcohol abuse in his father; Asthma in his sister; Diabetes in his father; Heart disease in his father; Hypertension in his mother. No Known Allergies Current Outpatient Medications on File Prior to Visit  Medication Sig  Dispense Refill  . ALPRAZolam (XANAX PO) Take by mouth.     No current facility-administered medications on file prior to visit.    Review of Systems Constitutional: Negative for other unusual diaphoresis, sweats, appetite or weight changes HENT: Negative for other worsening hearing loss, ear pain, facial swelling, mouth sores or neck stiffness.   Eyes: Negative for other worsening pain, redness or other visual disturbance.  Respiratory: Negative for other stridor or swelling Cardiovascular: Negative for other palpitations or other chest pain  Gastrointestinal: Negative for worsening diarrhea or loose stools, blood in stool, distention or other pain Genitourinary: Negative for hematuria, flank pain or other change in urine volume.  Musculoskeletal: Negative for myalgias or other joint swelling.  Skin: Negative for other color change, or other wound or worsening drainage.  Neurological: Negative for other syncope or numbness. Hematological: Negative for other adenopathy or swelling Psychiatric/Behavioral: Negative for hallucinations, other worsening agitation, SI, self-injury, or new decreased concentration All other system neg per pt    Objective:   Physical Exam BP 128/84   Pulse 88   Temp 98.2 F (36.8 C) (Oral)   Ht 6\' 1"  (1.854 m)   Wt 210 lb (95.3 kg)   SpO2 98%   BMI 27.71 kg/m  VS noted, athletic appearing Constitutional: Pt is oriented to person, place, and time. Appears well-developed and well-nourished, in no significant distress and comfortable Head: Normocephalic and atraumatic  Eyes: Conjunctivae and EOM are normal. Pupils are equal, round, and reactive to light Right Ear: External ear normal without  discharge Left Ear: External ear normal without discharge Nose: Nose without discharge or deformity Mouth/Throat: Oropharynx is without other ulcerations and moist  Neck: Normal range of motion. Neck supple. No JVD present. No tracheal deviation present or significant  neck LA or mass Cardiovascular: Normal rate, regular rhythm, normal heart sounds and intact distal pulses.  Pulmonary/Chest: WOB normal and breath sounds without rales or wheezing  Abdominal: Soft. Bowel sounds are normal. NT. No HSM  Musculoskeletal: Normal range of motion. Exhibits no edema Lymphadenopathy: Has no other cervical adenopathy.  Neurological: Pt is alert and oriented to person, place, and time. Pt has normal reflexes. No cranial nerve deficit. Motor grossly intact, Gait intact Skin: Skin is warm and dry. No rash noted or new ulcerations Psychiatric:  Has normal mood and affect. Behavior is normal without agitation No other exam findings  Lab Results  Component Value Date   WBC 5.3 09/29/2016   HGB 14.7 09/29/2016   HCT 42.6 09/29/2016   PLT 325 09/29/2016   GLUCOSE 84 09/29/2016   ALT 22 09/29/2016   AST 18 09/29/2016   NA 138 09/29/2016   K 3.8 09/29/2016   CL 104 09/29/2016   CREATININE 0.82 09/29/2016   BUN 12 09/29/2016   CO2 28 09/29/2016        Assessment & Plan:

## 2017-12-23 NOTE — Assessment & Plan Note (Signed)
stable overall by history and exam, recent data reviewed with pt, and pt to continue medical treatment as before,  to f/u any worsening symptoms or concerns BP Readings from Last 3 Encounters:  12/23/17 128/84  02/09/17 (!) 145/82  09/29/16 (!) 123/91

## 2017-12-23 NOTE — Assessment & Plan Note (Addendum)
Etiology unclear, for labs today, but consider referral to endo for r/o DI if simple cutting back on oral fluids per day does not improve symptoms  In addition to the time spent performing CPE, I spent an additional 20 minutes face to face,in which greater than 50% of this time was spent in counseling and coordination of care for patient's illness as documented, including the differential dx, treatment, further evaluation and other management of urinary frequency, possible OSA, hyperglycemia and HTN

## 2017-12-23 NOTE — Assessment & Plan Note (Signed)
stable overall by history and exam, recent data reviewed with pt, and pt to continue medical treatment as before,  to f/u any worsening symptoms or concerns, for f/u a1c with labs 

## 2018-01-14 ENCOUNTER — Encounter: Payer: Self-pay | Admitting: Gastroenterology

## 2018-02-18 ENCOUNTER — Institutional Professional Consult (permissible substitution): Payer: Self-pay | Admitting: Pulmonary Disease

## 2018-02-25 ENCOUNTER — Encounter: Payer: Self-pay | Admitting: Gastroenterology

## 2018-03-06 ENCOUNTER — Encounter: Payer: Self-pay | Admitting: Pulmonary Disease

## 2018-03-06 ENCOUNTER — Ambulatory Visit (INDEPENDENT_AMBULATORY_CARE_PROVIDER_SITE_OTHER): Payer: Managed Care, Other (non HMO) | Admitting: Pulmonary Disease

## 2018-03-06 VITALS — BP 120/88 | HR 82 | Ht 73.0 in | Wt 213.0 lb

## 2018-03-06 DIAGNOSIS — R0683 Snoring: Secondary | ICD-10-CM

## 2018-03-06 NOTE — Patient Instructions (Signed)
Moderate likelihood of significant sleep disordered breathing  We will set you up with a home sleep study  The possibility of being able to treat the sleep apnea with CPAP therapy  I will see you back in the office in about 8 weeks following initiation of treatment

## 2018-03-06 NOTE — Progress Notes (Signed)
Timothy Fuller    [id number]    [birth year]  Primary Care Physician:John, Hunt Oris, MD  Referring Physician: Biagio Borg, MD [place], [place] [address]  Chief complaint:   Witnessed apneas Increasing snoring  HPI:  Patient being seen today for increasing snoring, witnessed apneas Daytime sleepiness Occasional night sweats No nocturia No problems with his memory Some difficulty with his concentrating ability  He has a history of hypertension No other significant comorbidity  Smoking history: Reformed smoker  Outpatient Encounter Medications as of 03/06/2018  Medication Sig  . ALPRAZolam (XANAX PO) Take by mouth.  Marland Kitchen amLODipine (NORVASC) 5 MG tablet Take 1 tablet (5 mg total) by mouth daily.   No facility-administered encounter medications on file as of 03/06/2018.     Allergies as of 03/06/2018 - Review Complete 03/06/2018  Allergen Reaction Noted  . Penicillins  03/06/2018    Past Medical History:  Diagnosis Date  . Burn   . High cholesterol   . Hypertension   . Seizures ([place])    52yo last siezure    History reviewed. No pertinent surgical history.  Family History  Problem Relation Age of Onset  . Hypertension Mother   . Heart disease Father   . Diabetes Father   . Alcohol abuse Father   . Asthma Sister     Social History   Socioeconomic History  . Marital status: Married    Spouse name: Not on file  . Number of children: Not on file  . Years of education: Not on file  . Highest education level: Not on file  Occupational History  . Not on file  Social Needs  . Financial resource strain: Not on file  . Food insecurity:    Worry: Not on file    Inability: Not on file  . Transportation needs:    Medical: Not on file    Non-medical: Not on file  Tobacco Use  . Smoking status: Former Smoker    Packs/day: 1.00    Years: 31.00    Pack years: 31.00    Last attempt to quit: 07/01/2012    Years since quitting: 5.6  .  Smokeless tobacco: Never Used  Substance and Sexual Activity  . Alcohol use: No  . Drug use: No  . Sexual activity: Not on file  Lifestyle  . Physical activity:    Days per week: Not on file    Minutes per session: Not on file  . Stress: Not on file  Relationships  . Social connections:    Talks on phone: Not on file    Gets together: Not on file    Attends religious service: Not on file    Active member of club or organization: Not on file    Attends meetings of clubs or organizations: Not on file    Relationship status: Not on file  . Intimate partner violence:    Fear of current or ex partner: Not on file    Emotionally abused: Not on file    Physically abused: Not on file    Forced sexual activity: Not on file  Other Topics Concern  . Not on file  Social History Narrative  . Not on file    Review of Systems  Constitutional: Positive for fatigue.  Eyes: Negative.   Respiratory: Negative.   Cardiovascular: Negative.   Gastrointestinal: Negative.   Genitourinary: Negative.   Musculoskeletal: Negative.   Skin: Negative.  Psychiatric/Behavioral: Positive for sleep disturbance.  All other systems reviewed and are negative.   Vitals:   03/06/18 1613  BP: 120/88  Pulse: 82  SpO2: 97%     Physical Exam  Constitutional: He is oriented to person, place, and time. He appears well-developed and well-nourished.  HENT:  Head: Normocephalic and atraumatic.  Mallampati 2  Eyes: Pupils are equal, round, and reactive to light. Conjunctivae and EOM are normal. Left eye exhibits no discharge.  Neck: Normal range of motion. Neck supple. No tracheal deviation present. No thyromegaly present.  Cardiovascular: Normal rate.  Pulmonary/Chest: Effort normal and breath sounds normal. No respiratory distress. He has no wheezes.  Abdominal: Bowel sounds are normal. He exhibits no distension. There is no tenderness.  Musculoskeletal: Normal range of motion. He exhibits no edema or  deformity.  Neurological: He is alert and oriented to person, place, and time. He has normal reflexes. No cranial nerve deficit. Coordination normal.  Skin: Skin is warm and dry. No erythema.  Psychiatric: He has a normal mood and affect.   Assessment:  Moderate probability of significant sleep disordered breathing  Excessive daytime sleepiness   Plan/Recommendations:  We will plan for home sleep study   Pathophysiology of sleep disordered breathing discussed with patient  Options of treatment discussed  We will see him back in the office in about 8 weeks following initiation of treatment    Sherrilyn Rist MD [facility] 03/06/2018, 4:38 PM  CC: Biagio Borg, MD

## 2018-03-30 ENCOUNTER — Ambulatory Visit (AMBULATORY_SURGERY_CENTER): Payer: Self-pay

## 2018-03-30 ENCOUNTER — Encounter: Payer: Self-pay | Admitting: Gastroenterology

## 2018-03-30 VITALS — Ht 73.0 in | Wt 209.4 lb

## 2018-03-30 DIAGNOSIS — Z1211 Encounter for screening for malignant neoplasm of colon: Secondary | ICD-10-CM

## 2018-03-30 MED ORDER — PEG 3350-KCL-NA BICARB-NACL 420 G PO SOLR: 4000.0000 mL | Freq: Once | ORAL | 0 refills | Status: AC

## 2018-03-30 NOTE — Progress Notes (Signed)
Denies allergies to eggs or soy products. Denies complication of anesthesia or sedation. Denies use of weight loss medication. Denies use of O2.   Emmi instructions declined.  

## 2018-04-07 ENCOUNTER — Encounter: Payer: Self-pay | Admitting: Gastroenterology

## 2018-04-07 ENCOUNTER — Ambulatory Visit (AMBULATORY_SURGERY_CENTER): Payer: Managed Care, Other (non HMO) | Admitting: Gastroenterology

## 2018-04-07 VITALS — BP 126/69 | HR 73 | Temp 98.0°F | Resp 12

## 2018-04-07 DIAGNOSIS — D127 Benign neoplasm of rectosigmoid junction: Secondary | ICD-10-CM | POA: Diagnosis not present

## 2018-04-07 DIAGNOSIS — D120 Benign neoplasm of cecum: Secondary | ICD-10-CM | POA: Diagnosis not present

## 2018-04-07 DIAGNOSIS — D128 Benign neoplasm of rectum: Secondary | ICD-10-CM

## 2018-04-07 DIAGNOSIS — Z1211 Encounter for screening for malignant neoplasm of colon: Secondary | ICD-10-CM

## 2018-04-07 DIAGNOSIS — K635 Polyp of colon: Secondary | ICD-10-CM

## 2018-04-07 DIAGNOSIS — D129 Benign neoplasm of anus and anal canal: Secondary | ICD-10-CM

## 2018-04-07 MED ORDER — SODIUM CHLORIDE 0.9 % IV SOLN: 500.0000 mL | Freq: Once | INTRAVENOUS | Status: DC

## 2018-04-07 NOTE — Patient Instructions (Signed)
Information on polyps and high fiber diet. Metamucil fiber daily.     YOU HAD AN ENDOSCOPIC PROCEDURE TODAY AT [facility]:   Refer to the procedure report that was given to you for any specific questions about what was found during the examination.  If the procedure report does not answer your questions, please call your gastroenterologist to clarify.  If you requested that your care partner not be given the details of your procedure findings, then the procedure report has been included in a sealed envelope for you to review at your convenience later.  YOU SHOULD EXPECT: Some feelings of bloating in the abdomen. Passage of more gas than usual.  Walking can help get rid of the air that was put into your GI tract during the procedure and reduce the bloating. If you had a lower endoscopy (such as a colonoscopy or flexible sigmoidoscopy) you may notice spotting of blood in your stool or on the toilet paper. If you underwent a bowel prep for your procedure, you may not have a normal bowel movement for a few days.  Please Note:  You might notice some irritation and congestion in your nose or some drainage.  This is from the oxygen used during your procedure.  There is no need for concern and it should clear up in a day or so.  SYMPTOMS TO REPORT IMMEDIATELY:   Following lower endoscopy (colonoscopy or flexible sigmoidoscopy):  Excessive amounts of blood in the stool  Significant tenderness or worsening of abdominal pains  Swelling of the abdomen that is new, acute  Fever of 100F or higher   For urgent or emergent issues, a gastroenterologist can be reached at any hour by calling [phone].   DIET:  We do recommend a small meal at first, but then you may proceed to your regular diet.  Drink plenty of fluids but you should avoid alcoholic beverages for 24 hours.  ACTIVITY:  You should plan to take it easy for the rest of today and you should NOT DRIVE or use heavy machinery  until tomorrow (because of the sedation medicines used during the test).    FOLLOW UP: Our staff will call the number listed on your records the next business day following your procedure to check on you and address any questions or concerns that you may have regarding the information given to you following your procedure. If we do not reach you, we will leave a message.  However, if you are feeling well and you are not experiencing any problems, there is no need to return our call.  We will assume that you have returned to your regular daily activities without incident.  If any biopsies were taken you will be contacted by phone or by letter within the next 1-3 weeks.  Please call us at [phone] if you have not heard about the biopsies in 3 weeks.    SIGNATURES/CONFIDENTIALITY: You and/or your care partner have signed paperwork which will be entered into your electronic medical record.  These signatures attest to the fact that that the information above on your After Visit Summary has been reviewed and is understood.  Full responsibility of the confidentiality of this discharge information lies with you and/or your care-partner.

## 2018-04-07 NOTE — Progress Notes (Signed)
Called to room to assist during endoscopic procedure.  Patient ID and intended procedure confirmed with present staff. Received instructions for my participation in the procedure from the performing physician.  

## 2018-04-07 NOTE — Progress Notes (Signed)
Report given to PACU, vss 

## 2018-04-07 NOTE — Progress Notes (Signed)
Pt's states no medical or surgical changes since previsit or office visit. 

## 2018-04-07 NOTE — Op Note (Signed)
[place] Patient Name: Timothy Fuller Procedure Date: 04/07/2018 1:13 PM MRN: [id number] Endoscopist: Justice Britain , MD Age: 52 Referring MD:  Date of Birth: Mar 18, 1966 Gender: Male Account #: [id number] Procedure:                Colonoscopy Indications:              Screening for colorectal malignant neoplasm, This                            is the patient's first colonoscopy Medicines:                Monitored Anesthesia Care Procedure:                Pre-Anesthesia Assessment:                           - Prior to the procedure, a History and Physical                            was performed, and patient medications and                            allergies were reviewed. The patient's tolerance of                            previous anesthesia was also reviewed. The risks                            and benefits of the procedure and the sedation                            options and risks were discussed with the patient.                            All questions were answered, and informed consent                            was obtained. Prior Anticoagulants: The patient has                            taken no previous anticoagulant or antiplatelet                            agents. ASA Grade Assessment: II - A patient with                            mild systemic disease. After reviewing the risks                            and benefits, the patient was deemed in                            satisfactory condition to undergo the procedure.  After obtaining informed consent, the colonoscope                            was passed under direct vision. Throughout the                            procedure, the patient's blood pressure, pulse, and                            oxygen saturations were monitored continuously. The                            Colonoscope was introduced through the anus and                            advanced to the the cecum,  identified by                            appendiceal orifice and ileocecal valve. The                            colonoscopy was performed without difficulty. The                            patient tolerated the procedure. The quality of the                            bowel preparation was evaluated using the BBPS                            [facility] Bowel Preparation Scale) with scores of:                            Right Colon = 2 (minor amount of residual staining,                            small fragments of stool and/or opaque liquid, but                            mucosa seen well), Transverse Colon = 2 (minor                            amount of residual staining, small fragments of                            stool and/or opaque liquid, but mucosa seen well)                            and Left Colon = 3 (entire mucosa seen well with no                            residual staining, small fragments of stool or  opaque liquid). The total BBPS score equals 7. The                            quality of the bowel preparation was fair after                            copious lavage of the right colon. Scope In: 1:15:44 PM Scope Out: 1:38:47 PM Scope Withdrawal Time: 0 hours 18 minutes 31 seconds  Total Procedure Duration: 0 hours 23 minutes 3 seconds  Findings:                 The digital rectal exam findings include                            non-thrombosed internal hemorrhoids. Pertinent                            negatives include no palpable rectal lesions.                           A large amount of semi-liquid stool was found in                            the transverse colon, at the hepatic flexure and in                            the ascending colon, interfering with                            visualization. Lavage of the area was performed                            using copious amounts, resulting in clearance with                            fair  visualization.                           A 1 mm polyp was found in the cecum. The polyp was                            sessile. The polyp was removed with a jumbo cold                            forceps. Resection and retrieval were complete.                           Four sessile polyps were found in the rectum (1)                            and recto-sigmoid colon (3). The polyps were 2 to 4                            mm  in size. These polyps were removed with a cold                            snare. Resection and retrieval were complete.                           Scattered small-mouthed diverticula were found in                            the recto-sigmoid colon and sigmoid colon.                           Normal mucosa was found in the entire colon                            otherwise.                           Non-bleeding non-thrombosed internal hemorrhoids                            were found during retroflexion, during perianal                            exam and during digital exam. The hemorrhoids were                            medium-sized and Grade II (internal hemorrhoids                            that prolapse but reduce spontaneously). Complications:            No immediate complications. Estimated Blood Loss:     Estimated blood loss was minimal. Impression:               - Preparation of the colon was fair.                           - Non-thrombosed internal hemorrhoids found on                            digital rectal exam.                           - Stool in the transverse colon, at the hepatic                            flexure and in the ascending colon. Copious lavage                            performed.                           - One 1 mm polyp in the cecum, removed with a jumbo  cold forceps. Resected and retrieved.                           - Three 2 to 4 mm polyps in the rectum and at the                            recto-sigmoid  colon, removed with a cold snare.                            Resected and retrieved.                           - Diverticulosis in the recto-sigmoid colon and in                            the sigmoid colon.                           - Normal mucosa in the entire examined colon                            otherwise.                           - Non-bleeding non-thrombosed internal hemorrhoids. Recommendation:           - The patient will be observed post-procedure,                            until all discharge criteria are met.                           - Discharge patient to home.                           - Patient has a contact number available for                            emergencies. The signs and symptoms of potential                            delayed complications were discussed with the                            patient. Return to normal activities tomorrow.                            Written discharge instructions were provided to the                            patient.                           - High fiber diet.                           -  Use fiber, for example Citrucel, Fibercon, Konsyl                            or Metamucil.                           - Await pathology results.                           - Repeat colonoscopy in 3 - 5 years for                            surveillance based on pathology results and                            findings of adenomatous tissue. Even if no                            adenomatous tissue is found, due to the extensive                            amount of lavage required to clean the right colon                            to optimize visualization, an earlier colonoscopy                            should be considered at 5-years (but he would                            remain average risk).                           - The findings and recommendations were discussed                            with the patient.                            - The findings and recommendations were discussed                            with the patient's family. Justice Britain, MD 04/07/2018 1:49:04 PM

## 2018-04-08 ENCOUNTER — Telehealth: Payer: Self-pay | Admitting: *Deleted

## 2018-04-08 NOTE — Telephone Encounter (Deleted)
  Follow up Call-  Call back number 04/07/2018  Post procedure Call Back phone  # [phone]  Permission to leave phone message Yes  Some recent data might be hidden     Patient questions:  Do you have a fever, pain , or abdominal swelling? {yes no:314532} Pain Score  {NUMBERS; 0-10:5044} *  Have you tolerated food without any problems? {yes no:314532}  Have you been able to return to your normal activities? {yes no:314532}  Do you have any questions about your discharge instructions: Diet   {yes no:314532} Medications  {yes no:314532} Follow up visit  {yes no:314532}  Do you have questions or concerns about your Care? {yes no:314532}  Actions: * If pain score is 4 or above: {ACTION; [facility] ENDO PAIN >4:21563::"No action needed, pain <4."}

## 2018-04-08 NOTE — Telephone Encounter (Signed)
  Follow up Call-  Call back number 04/07/2018  Post procedure Call Back phone  # [phone]  Permission to leave phone message Yes  Some recent data might be hidden     Patient questions:  Do you have a fever, pain , or abdominal swelling? No. Pain Score  0 *  Have you tolerated food without any problems? Yes.    Have you been able to return to your normal activities? Yes.    Do you have any questions about your discharge instructions: Diet   No. Medications  No. Follow up visit  No.  Do you have questions or concerns about your Care? No.  Actions: * If pain score is 4 or above: No action needed, pain <4.

## 2018-04-12 ENCOUNTER — Encounter: Payer: Self-pay | Admitting: Gastroenterology

## 2018-05-07 ENCOUNTER — Telehealth: Payer: Self-pay | Admitting: Pulmonary Disease

## 2018-05-07 NOTE — Telephone Encounter (Signed)
-----   Message from Laurin Coder, MD sent at 04/28/2018  5:54 AM EDT ----- Regarding: RE: HST Thank you.  As long as we are documenting that we did try to reach out to him. He may have to changed his mind about wanting any treatment   Timothy Fuller  ----- Message ----- From: Tana [place] Sent: 04/24/2018   5:25 PM EDT To: Laurin Coder, MD, Madolyn Frieze, LPN Subject: HST                                            Dr. Ander Slade, I have tried to get this patient scheduled to pick up the HST machine. I have called him on 03/25/2018 and scheduled him to pick up the machine on 04/06/2018. He no showed that day and I even tried to call him to get him rescheduled after he no showed. I tried again to contact him on 04/15/2018 and no return call.  Thanks, Rodena Piety

## 2018-12-16 ENCOUNTER — Telehealth: Payer: Self-pay | Admitting: Internal Medicine

## 2018-12-16 NOTE — Telephone Encounter (Signed)
Medication:   ALPRAZolam (XANAX PO)    amLODipine (NORVASC) 5 MG tablet    Has the patient contacted their pharmacy? yes   Preferred Pharmacy (with phone number or street name):  [facility] [phone] - [place], [address] AT [address] [facility] [address] [phone] (Phone) [phone] (Fax)   Agent: Please be advised that RX refills may take up to 3 business days. We ask that you follow-up with your pharmacy.

## 2018-12-16 NOTE — Telephone Encounter (Signed)
See other note

## 2018-12-16 NOTE — Telephone Encounter (Signed)
Pt apparently has another provider as I have never prescribed xanax for him, and has gotten 3 rx in [place], feb and march.  He was last seen here june 2019.  Pt should be encouraged to continue with one provider only for any controlled substances  Pt is also due for yearly OV, thanks

## 2018-12-17 ENCOUNTER — Telehealth: Payer: Self-pay | Admitting: Internal Medicine

## 2018-12-17 NOTE — Telephone Encounter (Signed)
Medication: amLODipine (NORVASC) 5 MG tablet     Patient is requesting refill of this medication. Patient is scheduled for CPE on 6/26.  Pharmacy:  [facility] [place], [address] AT [place] [phone] (Phone) [phone]

## 2018-12-18 MED ORDER — AMLODIPINE BESYLATE 5 MG PO TABS: 5.0000 mg | ORAL_TABLET | Freq: Every day | ORAL | 0 refills | Status: DC

## 2018-12-25 ENCOUNTER — Encounter: Payer: Self-pay | Admitting: Internal Medicine

## 2018-12-25 ENCOUNTER — Other Ambulatory Visit: Payer: Self-pay

## 2018-12-25 ENCOUNTER — Ambulatory Visit (INDEPENDENT_AMBULATORY_CARE_PROVIDER_SITE_OTHER): Payer: Managed Care, Other (non HMO) | Admitting: Internal Medicine

## 2018-12-25 ENCOUNTER — Other Ambulatory Visit (INDEPENDENT_AMBULATORY_CARE_PROVIDER_SITE_OTHER): Payer: Managed Care, Other (non HMO)

## 2018-12-25 ENCOUNTER — Ambulatory Visit: Payer: Self-pay | Admitting: Internal Medicine

## 2018-12-25 VITALS — BP 126/86 | HR 91 | Temp 97.7°F | Ht 73.0 in | Wt 203.0 lb

## 2018-12-25 DIAGNOSIS — F419 Anxiety disorder, unspecified: Secondary | ICD-10-CM

## 2018-12-25 DIAGNOSIS — E611 Iron deficiency: Secondary | ICD-10-CM

## 2018-12-25 DIAGNOSIS — E291 Testicular hypofunction: Secondary | ICD-10-CM | POA: Diagnosis not present

## 2018-12-25 DIAGNOSIS — Z0001 Encounter for general adult medical examination with abnormal findings: Secondary | ICD-10-CM | POA: Diagnosis not present

## 2018-12-25 DIAGNOSIS — E538 Deficiency of other specified B group vitamins: Secondary | ICD-10-CM

## 2018-12-25 DIAGNOSIS — R739 Hyperglycemia, unspecified: Secondary | ICD-10-CM | POA: Diagnosis not present

## 2018-12-25 DIAGNOSIS — N529 Male erectile dysfunction, unspecified: Secondary | ICD-10-CM

## 2018-12-25 DIAGNOSIS — E559 Vitamin D deficiency, unspecified: Secondary | ICD-10-CM

## 2018-12-25 DIAGNOSIS — Z86010 Personal history of colonic polyps: Secondary | ICD-10-CM | POA: Insufficient documentation

## 2018-12-25 DIAGNOSIS — Z23 Encounter for immunization: Secondary | ICD-10-CM

## 2018-12-25 DIAGNOSIS — I10 Essential (primary) hypertension: Secondary | ICD-10-CM

## 2018-12-25 LAB — LIPID PANEL
Cholesterol: 207 mg/dL — ABNORMAL HIGH (ref 0–200)
HDL: 34.5 mg/dL — ABNORMAL LOW (ref >39.00)
LDL Cholesterol: 144 mg/dL — ABNORMAL HIGH (ref 0–99)
NonHDL: 172.13
Total CHOL/HDL Ratio: 6
Triglycerides: 139 mg/dL (ref 0.0–149.0)
VLDL: 27.8 mg/dL (ref 0.0–40.0)

## 2018-12-25 LAB — URINALYSIS, ROUTINE W REFLEX MICROSCOPIC
Bilirubin Urine: NEGATIVE
Ketones, ur: NEGATIVE
Leukocytes,Ua: NEGATIVE
Nitrite: NEGATIVE
Specific Gravity, Urine: 1.025 (ref 1.000–1.030)
Urine Glucose: NEGATIVE
Urobilinogen, UA: 0.2 (ref 0.0–1.0)
pH: 6.5 (ref 5.0–8.0)

## 2018-12-25 LAB — HEPATIC FUNCTION PANEL
ALT: 23 U/L (ref 0–53)
AST: 17 U/L (ref 0–37)
Albumin: 4.3 g/dL (ref 3.5–5.2)
Alkaline Phosphatase: 50 U/L (ref 39–117)
Bilirubin, Direct: 0.1 mg/dL (ref 0.0–0.3)
Total Bilirubin: 0.5 mg/dL (ref 0.2–1.2)
Total Protein: 7.4 g/dL (ref 6.0–8.3)

## 2018-12-25 LAB — BASIC METABOLIC PANEL
BUN: 14 mg/dL (ref 6–23)
CO2: 31 mEq/L (ref 19–32)
Calcium: 9.4 mg/dL (ref 8.4–10.5)
Chloride: 101 mEq/L (ref 96–112)
Creatinine, Ser: 1.08 mg/dL (ref 0.40–1.50)
GFR: 87.18 mL/min (ref >60.00)
Glucose, Bld: 78 mg/dL (ref 70–99)
Potassium: 4.4 mEq/L (ref 3.5–5.1)
Sodium: 138 mEq/L (ref 135–145)

## 2018-12-25 LAB — CBC WITH DIFFERENTIAL/PLATELET
Basophils Absolute: 0 10*3/uL (ref 0.0–0.1)
Basophils Relative: 0.4 % (ref 0.0–3.0)
Eosinophils Absolute: 0 10*3/uL (ref 0.0–0.7)
Eosinophils Relative: 0.8 % (ref 0.0–5.0)
HCT: 49.6 % (ref 39.0–52.0)
Hemoglobin: 16.3 g/dL (ref 13.0–17.0)
Lymphocytes Relative: 52.2 % — ABNORMAL HIGH (ref 12.0–46.0)
Lymphs Abs: 2.4 10*3/uL (ref 0.7–4.0)
MCHC: 32.9 g/dL (ref 30.0–36.0)
MCV: 81.3 fl (ref 78.0–100.0)
Monocytes Absolute: 0.5 10*3/uL (ref 0.1–1.0)
Monocytes Relative: 11.5 % (ref 3.0–12.0)
Neutro Abs: 1.6 10*3/uL (ref 1.4–7.7)
Neutrophils Relative %: 35.1 % — ABNORMAL LOW (ref 43.0–77.0)
Platelets: 256 10*3/uL (ref 150.0–400.0)
RBC: 6.1 Mil/uL — ABNORMAL HIGH (ref 4.22–5.81)
RDW: 14.5 % (ref 11.5–15.5)
WBC: 4.6 10*3/uL (ref 4.0–10.5)

## 2018-12-25 LAB — VITAMIN B12: Vitamin B-12: 825 pg/mL (ref 211–911)

## 2018-12-25 LAB — IBC PANEL
Iron: 72 ug/dL (ref 42–165)
Saturation Ratios: 20.1 % (ref 20.0–50.0)
Transferrin: 256 mg/dL (ref 212.0–360.0)

## 2018-12-25 LAB — HEMOGLOBIN A1C: Hgb A1c MFr Bld: 6.1 % (ref 4.6–6.5)

## 2018-12-25 LAB — TSH: TSH: 1.23 u[IU]/mL (ref 0.35–4.50)

## 2018-12-25 LAB — PSA: PSA: 0.51 ng/mL (ref 0.10–4.00)

## 2018-12-25 LAB — VITAMIN D 25 HYDROXY (VIT D DEFICIENCY, FRACTURES): VITD: 32.64 ng/mL (ref 30.00–100.00)

## 2018-12-25 MED ORDER — CITALOPRAM HYDROBROMIDE 10 MG PO TABS: 10.0000 mg | ORAL_TABLET | Freq: Every day | ORAL | 3 refills | Status: DC

## 2018-12-25 MED ORDER — SILDENAFIL CITRATE 100 MG PO TABS: 50.0000 mg | ORAL_TABLET | Freq: Every day | ORAL | 11 refills | Status: DC | PRN

## 2018-12-25 MED ORDER — ALPRAZOLAM 1 MG PO TABS: 1.0000 mg | ORAL_TABLET | Freq: Three times a day (TID) | ORAL | 2 refills | Status: DC | PRN

## 2018-12-25 NOTE — Patient Instructions (Addendum)
You had the Tdap tetanus shot today  Please take all new medication as prescribed - the celexa 10 mg per day  Please continue all other medications as before, and refills have been done if requested  - the xanax  Please have the pharmacy call with any other refills you may need.  Please continue your efforts at being more active, low cholesterol diet, and weight control.  You are otherwise up to date with prevention measures today.  Please keep your appointments with your specialists as you may have planned  Please go to the LAB in the Basement (turn left off the elevator) for the tests to be done today  You will be contacted by phone if any changes need to be made immediately.  Otherwise, you will receive a letter about your results with an explanation, but please check with MyChart first.  Please remember to sign up for MyChart if you have not done so, as this will be important to you in the future with finding out test results, communicating by private email, and scheduling acute appointments online when needed.  Please return in 1 year for your yearly visit, or sooner if needed, with Lab testing done 3-5 days before

## 2018-12-25 NOTE — Assessment & Plan Note (Signed)
D/w pt, declines tx at this time

## 2018-12-27 ENCOUNTER — Encounter: Payer: Self-pay | Admitting: Internal Medicine

## 2018-12-27 NOTE — Assessment & Plan Note (Signed)
stable overall by history and exam, recent data reviewed with pt, and pt to continue medical treatment as before,  to f/u any worsening symptoms or concerns  

## 2018-12-27 NOTE — Assessment & Plan Note (Signed)

## 2018-12-27 NOTE — Assessment & Plan Note (Signed)
For f/u vit d level may need further replacement

## 2018-12-27 NOTE — Assessment & Plan Note (Addendum)
Uncontrolled and significant, for celexa 10 qd, and xanax tid prn,  to f/u any worsening symptoms or concerns  In addition to the time spent performing CPE, I spent an additional 25 minutes face to face,in which greater than 50% of this time was spent in counseling and coordination of care for patient's acute illness as documented, including the differential dx, treatment, further evaluation and other management of anxiety, hyperglycemia, HTN, vit d deficiency, ED and hypogonadism

## 2018-12-27 NOTE — Assessment & Plan Note (Signed)
Mild to mod, for viagra prn,  to f/u any worsening symptoms or concerns 
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
## 2018-12-29 ENCOUNTER — Other Ambulatory Visit: Payer: Self-pay | Admitting: Internal Medicine

## 2019-03-29 ENCOUNTER — Other Ambulatory Visit: Payer: Self-pay

## 2019-03-29 ENCOUNTER — Ambulatory Visit (INDEPENDENT_AMBULATORY_CARE_PROVIDER_SITE_OTHER): Payer: Managed Care, Other (non HMO) | Admitting: Internal Medicine

## 2019-03-29 ENCOUNTER — Encounter: Payer: Self-pay | Admitting: Internal Medicine

## 2019-03-29 DIAGNOSIS — G4710 Hypersomnia, unspecified: Secondary | ICD-10-CM

## 2019-03-29 DIAGNOSIS — E7800 Pure hypercholesterolemia, unspecified: Secondary | ICD-10-CM

## 2019-03-29 DIAGNOSIS — I10 Essential (primary) hypertension: Secondary | ICD-10-CM

## 2019-03-29 DIAGNOSIS — R739 Hyperglycemia, unspecified: Secondary | ICD-10-CM | POA: Diagnosis not present

## 2019-03-29 MED ORDER — ALPRAZOLAM 1 MG PO TABS: 1.0000 mg | ORAL_TABLET | Freq: Three times a day (TID) | ORAL | 2 refills | Status: DC | PRN

## 2019-03-29 NOTE — Assessment & Plan Note (Signed)
Timothy Fuller for pulm referral, likely OSA needs CPAP

## 2019-03-29 NOTE — Assessment & Plan Note (Signed)
Mod elevated, declines statin for now, for low chol diet and f/u next visit

## 2019-03-29 NOTE — Patient Instructions (Addendum)
Please continue all other medications as before, and refills have been done if requested.  Please have the pharmacy call with any other refills you may need.  Please continue your efforts at being more active, low cholesterol diet, and weight control.  Please keep your appointments with your specialists as you may have planned  You will be contacted regarding the referral for: pulmonary 

## 2019-03-29 NOTE — Assessment & Plan Note (Signed)
stable overall by history and exam, recent data reviewed with pt, and pt to continue medical treatment as before,  to f/u any worsening symptoms or concerns  
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
## 2019-04-13 ENCOUNTER — Telehealth: Payer: Self-pay

## 2019-04-13 NOTE — Telephone Encounter (Signed)
Pt scheduled  

## 2019-04-13 NOTE — Telephone Encounter (Signed)
Copied from [place] [id number]. Topic: Referral - Status >> Apr 13, 2019  3:32 PM Rutherford Nail, [place] wrote: Reason for CRM: Patient calling to check status of referral for sleep study. States that he has not heard anything from anyone. Please advise.

## 2019-04-22 ENCOUNTER — Ambulatory Visit: Payer: Managed Care, Other (non HMO) | Admitting: Pulmonary Disease

## 2019-04-22 ENCOUNTER — Encounter: Payer: Self-pay | Admitting: *Deleted

## 2019-04-22 ENCOUNTER — Other Ambulatory Visit: Payer: Self-pay

## 2019-04-22 ENCOUNTER — Encounter: Payer: Self-pay | Admitting: Pulmonary Disease

## 2019-04-22 VITALS — BP 130/82 | HR 74 | Ht 74.0 in | Wt 210.8 lb

## 2019-04-22 DIAGNOSIS — R0683 Snoring: Secondary | ICD-10-CM
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
## 2019-04-22 NOTE — Patient Instructions (Signed)
Snoring, daytime sleepiness Moderate probability of significant sleep disordered breathing  We will schedule a home sleep study Treatment options as discussed  We will follow-up with you in about 3 months Sleep Apnea Sleep apnea is a condition in which breathing pauses or becomes shallow during sleep. Episodes of sleep apnea usually last 10 seconds or longer, and they may occur as many as 20 times an hour. Sleep apnea disrupts your sleep and keeps your body from getting the rest that it needs. This condition can increase your risk of certain health problems, including:  Heart attack.  Stroke.  Obesity.  Diabetes.  Heart failure.  Irregular heartbeat. What are the causes? There are three kinds of sleep apnea:  Obstructive sleep apnea. This kind is caused by a blocked or collapsed airway.  Central sleep apnea. This kind happens when the part of the brain that controls breathing does not send the correct signals to the muscles that control breathing.  Mixed sleep apnea. This is a combination of obstructive and central sleep apnea. The most common cause of this condition is a collapsed or blocked airway. An airway can collapse or become blocked if:  Your throat muscles are abnormally relaxed.  Your tongue and tonsils are larger than normal.  You are overweight.  Your airway is smaller than normal. What increases the risk? You are more likely to develop this condition if you:  Are overweight.  Smoke.  Have a smaller than normal airway.  Are elderly.  Are male.  Drink alcohol.  Take sedatives or tranquilizers.  Have a family history of sleep apnea. What are the signs or symptoms? Symptoms of this condition include:  Trouble staying asleep.  Daytime sleepiness and tiredness.  Irritability.  Loud snoring.  Morning headaches.  Trouble concentrating.  Forgetfulness.  Decreased interest in sex.  Unexplained sleepiness.  Mood swings.  Personality  changes.  Feelings of depression.  Waking up often during the night to urinate.  Dry mouth.  Sore throat. How is this diagnosed? This condition may be diagnosed with:  A medical history.  A physical exam.  A series of tests that are done while you are sleeping (sleep study). These tests are usually done in a sleep lab, but they may also be done at home. How is this treated? Treatment for this condition aims to restore normal breathing and to ease symptoms during sleep. It may involve managing health issues that can affect breathing, such as high blood pressure or obesity. Treatment may include:  Sleeping on your side.  Using a decongestant if you have nasal congestion.  Avoiding the use of depressants, including alcohol, sedatives, and narcotics.  Losing weight if you are overweight.  Making changes to your diet.  Quitting smoking.  Using a device to open your airway while you sleep, such as: ? An oral appliance. This is a custom-made mouthpiece that shifts your lower jaw forward. ? A continuous positive airway pressure (CPAP) device. This device blows air through a mask when you breathe out (exhale). ? A nasal expiratory positive airway pressure (EPAP) device. This device has valves that you put into each nostril. ? A bi-level positive airway pressure (BPAP) device. This device blows air through a mask when you breathe in (inhale) and breathe out (exhale).  Having surgery if other treatments do not work. During surgery, excess tissue is removed to create a wider airway. It is important to get treatment for sleep apnea. Without treatment, this condition can lead to:  High blood  pressure.  Coronary artery disease.  In men, an inability to achieve or maintain an erection (impotence).  Reduced thinking abilities. Follow these instructions at home: Lifestyle  Make any lifestyle changes that your health care provider recommends.  Eat a healthy, well-balanced diet.   Take steps to lose weight if you are overweight.  Avoid using depressants, including alcohol, sedatives, and narcotics.  Do not use any products that contain nicotine or tobacco, such as cigarettes, e-cigarettes, and chewing tobacco. If you need help quitting, ask your health care provider. General instructions  Take over-the-counter and prescription medicines only as told by your health care provider.  If you were given a device to open your airway while you sleep, use it only as told by your health care provider.  If you are having surgery, make sure to tell your health care provider you have sleep apnea. You may need to bring your device with you.  Keep all follow-up visits as told by your health care provider. This is important. Contact a health care provider if:  The device that you received to open your airway during sleep is uncomfortable or does not seem to be working.  Your symptoms do not improve.  Your symptoms get worse. Get help right away if:  You develop: ? Chest pain. ? Shortness of breath. ? Discomfort in your back, arms, or stomach.  You have: ? Trouble speaking. ? Weakness on one side of your body. ? Drooping in your face. These symptoms may represent a serious problem that is an emergency. Do not wait to see if the symptoms will go away. Get medical help right away. Call your local emergency services (911 in the [place] Do not drive yourself to the hospital. Summary  Sleep apnea is a condition in which breathing pauses or becomes shallow during sleep.  The most common cause is a collapsed or blocked airway.  The goal of treatment is to restore normal breathing and to ease symptoms during sleep. This information is not intended to replace advice given to you by your health care provider. Make sure you discuss any questions you have with your health care provider. Document Released: 06/07/2002 Document Revised: 04/03/2018 Document Reviewed: 02/10/2018  Elsevier Patient Education  2020 [employer].

## 2019-05-05 ENCOUNTER — Other Ambulatory Visit: Payer: Self-pay

## 2019-05-05 ENCOUNTER — Ambulatory Visit: Payer: Managed Care, Other (non HMO)

## 2019-05-05 DIAGNOSIS — G4733 Obstructive sleep apnea (adult) (pediatric): Secondary | ICD-10-CM

## 2019-05-05 DIAGNOSIS — R0683 Snoring: Secondary | ICD-10-CM

## 2019-05-11 ENCOUNTER — Telehealth: Payer: Self-pay | Admitting: Pulmonary Disease

## 2019-05-11 DIAGNOSIS — G4733 Obstructive sleep apnea (adult) (pediatric): Secondary | ICD-10-CM

## 2019-05-11 NOTE — Telephone Encounter (Signed)
Dr. Ander Slade has reviewed the home sleep test this showed severe sleep apnea.   Recommendations   Treatment options are CPAP with the settings auto 5 to 20.    Weight loss measures .   Advise against driving while sleepy & against medication with sedative side effects.    Make appointment for 3 months for compliance with download with Dr. Ander Slade.    Called and spoke with Patient.  Dr Judson Roch results and recommendations given.  DME order placed.  Patient aware he will need follow up OV in 3 months for compliance.  Nothing further at this time.

## 2019-07-06 ENCOUNTER — Encounter: Payer: Self-pay | Admitting: Internal Medicine

## 2019-07-06 ENCOUNTER — Ambulatory Visit (INDEPENDENT_AMBULATORY_CARE_PROVIDER_SITE_OTHER): Payer: Managed Care, Other (non HMO) | Admitting: Internal Medicine

## 2019-07-06 ENCOUNTER — Encounter: Payer: Self-pay | Admitting: *Deleted

## 2019-07-06 ENCOUNTER — Other Ambulatory Visit: Payer: Self-pay

## 2019-07-06 DIAGNOSIS — R739 Hyperglycemia, unspecified: Secondary | ICD-10-CM

## 2019-07-06 DIAGNOSIS — F419 Anxiety disorder, unspecified: Secondary | ICD-10-CM | POA: Diagnosis not present

## 2019-07-06 DIAGNOSIS — I10 Essential (primary) hypertension: Secondary | ICD-10-CM

## 2019-07-06 DIAGNOSIS — J309 Allergic rhinitis, unspecified: Secondary | ICD-10-CM | POA: Diagnosis not present

## 2019-07-06 MED ORDER — TRIAMCINOLONE ACETONIDE 55 MCG/ACT NA AERO: 2.0000 | INHALATION_SPRAY | Freq: Every day | NASAL | 12 refills | Status: DC

## 2019-07-06 MED ORDER — ALPRAZOLAM 1 MG PO TABS: 1.0000 mg | ORAL_TABLET | Freq: Three times a day (TID) | ORAL | 2 refills | Status: DC | PRN

## 2019-07-06 MED ORDER — CETIRIZINE HCL 10 MG PO TABS: 10.0000 mg | ORAL_TABLET | Freq: Every day | ORAL | 11 refills | Status: DC
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
## 2019-07-06 NOTE — Patient Instructions (Signed)
Please take all new medication as prescribed - the zyrtec and nasacort for allergies  You can also take Mucinex (or it's generic off brand) for congestion in the ears, and tylenol as needed for pain.  Please continue all other medications as before, and refills have been done if requested - the xanax  Please have the pharmacy call with any other refills you may need.  Please continue your efforts at being more active, low cholesterol diet, and weight control - very important so the sleep apnea does not get worse  Please keep your appointments with your specialists as you may have planned

## 2019-07-07 ENCOUNTER — Telehealth: Payer: Self-pay | Admitting: Internal Medicine

## 2019-07-07 NOTE — Telephone Encounter (Signed)
Will route to office for final disposition; also see CRM [phone]; the pt sees Dr Cathlean Cower, [place].

## 2019-07-07 NOTE — Telephone Encounter (Signed)
Copied from [place] [id number]. Topic: Quick Communication - Rx Refill/Question >> Jul 07, 2019 11:36 AM Rainey Pines A wrote: Medication: sildenafil (VIAGRA) 20 mg (Patient stated that he needs medication sent back over to pharmacy for 20mg  due to the pcs program that [facility] has and for insurance purposes.)  Has the patient contacted their pharmacy? {Yes (Agent: If no, request that the patient contact the pharmacy for the refill.) (Agent: If yes, when and what did the pharmacy advise?)Contact PCP  Preferred Pharmacy (with phone number or street name): [facility] [phone] - [place], [address]  Phone:  [phone] Fax:  [phone]     Agent: Please be advised that RX refills may take up to 3 business days. We ask that you follow-up with your pharmacy.

## 2019-07-08 MED ORDER — SILDENAFIL CITRATE 20 MG PO TABS: ORAL_TABLET | ORAL | 5 refills | Status: DC

## 2019-07-08 NOTE — Telephone Encounter (Signed)
Done erx 

## 2019-07-08 NOTE — Addendum Note (Signed)
Addended by: Biagio Borg on: 07/08/2019 05:24 PM   Modules accepted: Orders

## 2019-07-11 ENCOUNTER — Encounter: Payer: Self-pay | Admitting: Internal Medicine

## 2019-07-11 DIAGNOSIS — J309 Allergic rhinitis, unspecified: Secondary | ICD-10-CM | POA: Insufficient documentation

## 2019-07-11 NOTE — Assessment & Plan Note (Signed)
stable overall by history and exam, recent data reviewed with pt, and pt to continue medical treatment as before,  to f/u any worsening symptoms or concerns  

## 2019-07-11 NOTE — Assessment & Plan Note (Signed)
Stable, for xanax refill

## 2019-07-11 NOTE — Assessment & Plan Note (Signed)
Mild to mod, for zyrtec and nasacort asd,  to f/u any worsening symptoms or concerns 

## 2019-07-21 ENCOUNTER — Ambulatory Visit: Payer: Managed Care, Other (non HMO) | Admitting: Pulmonary Disease

## 2019-08-10 ENCOUNTER — Ambulatory Visit: Payer: Managed Care, Other (non HMO) | Admitting: Pulmonary Disease

## 2019-08-29 ENCOUNTER — Other Ambulatory Visit: Payer: Self-pay | Admitting: Internal Medicine

## 2019-08-29 NOTE — Telephone Encounter (Signed)
Please refill as per office routine med refill policy (all routine meds refilled for 3 mo or monthly per pt preference up to one year from last visit, then month to month grace period for 3 mo, then further med refills will have to be denied)  

## 2019-09-07 ENCOUNTER — Ambulatory Visit: Payer: Managed Care, Other (non HMO) | Admitting: Pulmonary Disease

## 2019-10-03 ENCOUNTER — Other Ambulatory Visit: Payer: Self-pay | Admitting: Internal Medicine

## 2019-10-04 NOTE — Telephone Encounter (Signed)
Done erx 

## 2019-11-13 ENCOUNTER — Encounter (HOSPITAL_COMMUNITY): Payer: Self-pay

## 2019-11-13 ENCOUNTER — Observation Stay (HOSPITAL_COMMUNITY)
Admission: EM | Admit: 2019-11-13 | Discharge: 2019-11-14 | Disposition: A | Discharge status: Home / Self Care | Payer: Managed Care, Other (non HMO) | Attending: Internal Medicine | Admitting: Internal Medicine

## 2019-11-13 ENCOUNTER — Emergency Department (HOSPITAL_COMMUNITY): Payer: Managed Care, Other (non HMO)

## 2019-11-13 ENCOUNTER — Other Ambulatory Visit: Payer: Self-pay

## 2019-11-13 DIAGNOSIS — Z20822 Contact with and (suspected) exposure to covid-19: Secondary | ICD-10-CM | POA: Insufficient documentation

## 2019-11-13 DIAGNOSIS — I10 Essential (primary) hypertension: Secondary | ICD-10-CM | POA: Diagnosis present

## 2019-11-13 DIAGNOSIS — E785 Hyperlipidemia, unspecified: Secondary | ICD-10-CM | POA: Insufficient documentation

## 2019-11-13 DIAGNOSIS — R569 Unspecified convulsions: Secondary | ICD-10-CM

## 2019-11-13 DIAGNOSIS — Z79899 Other long term (current) drug therapy: Secondary | ICD-10-CM | POA: Diagnosis not present

## 2019-11-13 DIAGNOSIS — F329 Major depressive disorder, single episode, unspecified: Secondary | ICD-10-CM | POA: Insufficient documentation

## 2019-11-13 DIAGNOSIS — Z87891 Personal history of nicotine dependence: Secondary | ICD-10-CM | POA: Insufficient documentation

## 2019-11-13 DIAGNOSIS — F419 Anxiety disorder, unspecified: Secondary | ICD-10-CM | POA: Diagnosis not present

## 2019-11-13 DIAGNOSIS — R072 Precordial pain: Principal | ICD-10-CM | POA: Insufficient documentation

## 2019-11-13 DIAGNOSIS — J309 Allergic rhinitis, unspecified: Secondary | ICD-10-CM | POA: Diagnosis not present

## 2019-11-13 DIAGNOSIS — R079 Chest pain, unspecified: Secondary | ICD-10-CM | POA: Diagnosis present

## 2019-11-13 DIAGNOSIS — E7800 Pure hypercholesterolemia, unspecified: Secondary | ICD-10-CM | POA: Diagnosis present

## 2019-11-13 LAB — LIPID PANEL
Cholesterol: 182 mg/dL (ref 0–200)
HDL: 35 mg/dL — ABNORMAL LOW (ref >40)
LDL Cholesterol: 113 mg/dL — ABNORMAL HIGH (ref 0–99)
Total CHOL/HDL Ratio: 5.2 RATIO
Triglycerides: 170 mg/dL — ABNORMAL HIGH (ref <150)
VLDL: 34 mg/dL (ref 0–40)

## 2019-11-13 LAB — COMPREHENSIVE METABOLIC PANEL
ALT: 58 U/L — ABNORMAL HIGH (ref 0–44)
AST: 37 U/L (ref 15–41)
Albumin: 3.8 g/dL (ref 3.5–5.0)
Alkaline Phosphatase: 46 U/L (ref 38–126)
Anion gap: 7 (ref 5–15)
BUN: 16 mg/dL (ref 6–20)
CO2: 28 mmol/L (ref 22–32)
Calcium: 8.8 mg/dL — ABNORMAL LOW (ref 8.9–10.3)
Chloride: 101 mmol/L (ref 98–111)
Creatinine, Ser: 1.02 mg/dL (ref 0.61–1.24)
GFR calc Af Amer: >60 mL/min (ref >60)
GFR calc non Af Amer: >60 mL/min (ref >60)
Glucose, Bld: 102 mg/dL — ABNORMAL HIGH (ref 70–99)
Potassium: 4.3 mmol/L (ref 3.5–5.1)
Sodium: 136 mmol/L (ref 135–145)
Total Bilirubin: 0.5 mg/dL (ref 0.3–1.2)
Total Protein: 7 g/dL (ref 6.5–8.1)

## 2019-11-13 LAB — PROTIME-INR
INR: 1.1 (ref 0.8–1.2)
Prothrombin Time: 13.3 seconds (ref 11.4–15.2)

## 2019-11-13 LAB — CBC WITH DIFFERENTIAL/PLATELET
Abs Immature Granulocytes: 0.01 10*3/uL (ref 0.00–0.07)
Basophils Absolute: 0 10*3/uL (ref 0.0–0.1)
Basophils Relative: 0 %
Eosinophils Absolute: 0.2 10*3/uL (ref 0.0–0.5)
Eosinophils Relative: 3 %
HCT: 46.2 % (ref 39.0–52.0)
Hemoglobin: 14.8 g/dL (ref 13.0–17.0)
Immature Granulocytes: 0 %
Lymphocytes Relative: 45 %
Lymphs Abs: 2.4 10*3/uL (ref 0.7–4.0)
MCH: 26.7 pg (ref 26.0–34.0)
MCHC: 32 g/dL (ref 30.0–36.0)
MCV: 83.2 fL (ref 80.0–100.0)
Monocytes Absolute: 0.7 10*3/uL (ref 0.1–1.0)
Monocytes Relative: 13 %
Neutro Abs: 2.2 10*3/uL (ref 1.7–7.7)
Neutrophils Relative %: 39 %
Platelets: 268 10*3/uL (ref 150–400)
RBC: 5.55 MIL/uL (ref 4.22–5.81)
RDW: 14.2 % (ref 11.5–15.5)
WBC: 5.5 10*3/uL (ref 4.0–10.5)
nRBC: 0 % (ref 0.0–0.2)

## 2019-11-13 LAB — CBC
HCT: 47.6 % (ref 39.0–52.0)
Hemoglobin: 15.2 g/dL (ref 13.0–17.0)
MCH: 26.3 pg (ref 26.0–34.0)
MCHC: 31.9 g/dL (ref 30.0–36.0)
MCV: 82.5 fL (ref 80.0–100.0)
Platelets: 269 10*3/uL (ref 150–400)
RBC: 5.77 MIL/uL (ref 4.22–5.81)
RDW: 14.2 % (ref 11.5–15.5)
WBC: 6 10*3/uL (ref 4.0–10.5)
nRBC: 0 % (ref 0.0–0.2)

## 2019-11-13 LAB — SARS CORONAVIRUS 2 BY RT PCR (HOSPITAL ORDER, PERFORMED IN ~~LOC~~ HOSPITAL LAB): SARS Coronavirus 2: NEGATIVE

## 2019-11-13 LAB — TROPONIN I (HIGH SENSITIVITY)
Troponin I (High Sensitivity): <2 ng/L (ref <18)
Troponin I (High Sensitivity): <2 ng/L (ref <18)
Troponin I (High Sensitivity): 2 ng/L (ref <18)
Troponin I (High Sensitivity): <2 ng/L (ref <18)

## 2019-11-13 LAB — HEMOGLOBIN A1C
Hgb A1c MFr Bld: 6.1 % — ABNORMAL HIGH (ref 4.8–5.6)
Mean Plasma Glucose: 128.37 mg/dL

## 2019-11-13 LAB — APTT: aPTT: 25 seconds (ref 24–36)

## 2019-11-13 LAB — CREATININE, SERUM
Creatinine, Ser: 0.9 mg/dL (ref 0.61–1.24)
GFR calc Af Amer: >60 mL/min (ref >60)
GFR calc non Af Amer: >60 mL/min (ref >60)

## 2019-11-13 MED ORDER — SODIUM CHLORIDE 0.9% FLUSH
3.0000 mL | Freq: Once | INTRAVENOUS | Status: AC
  Administered 2019-11-13: 3 mL via INTRAVENOUS

## 2019-11-13 MED ORDER — PANTOPRAZOLE SODIUM 40 MG PO TBEC
40.0000 mg | DELAYED_RELEASE_TABLET | Freq: Every day | ORAL | Status: DC
  Administered 2019-11-13 – 2019-11-14 (×2): 40 mg via ORAL
  Filled 2019-11-13 (×2): qty 1

## 2019-11-13 MED ORDER — LORATADINE 10 MG PO TABS
10.0000 mg | ORAL_TABLET | Freq: Every day | ORAL | Status: DC
  Administered 2019-11-13 – 2019-11-14 (×2): 10 mg via ORAL
  Filled 2019-11-13 (×2): qty 1

## 2019-11-13 MED ORDER — AMLODIPINE BESYLATE 5 MG PO TABS
5.0000 mg | ORAL_TABLET | Freq: Every day | ORAL | Status: DC
  Administered 2019-11-13 – 2019-11-14 (×2): 5 mg via ORAL
  Filled 2019-11-13 (×2): qty 1

## 2019-11-13 MED ORDER — ENOXAPARIN SODIUM 40 MG/0.4ML ~~LOC~~ SOLN
40.0000 mg | SUBCUTANEOUS | Status: DC
  Administered 2019-11-13: 40 mg via SUBCUTANEOUS
  Filled 2019-11-13: qty 0.4

## 2019-11-13 MED ORDER — NITROGLYCERIN 0.4 MG SL SUBL
0.4000 mg | SUBLINGUAL_TABLET | SUBLINGUAL | Status: DC | PRN
  Administered 2019-11-13 – 2019-11-14 (×7): 0.4 mg via SUBLINGUAL
  Filled 2019-11-13 (×5): qty 1

## 2019-11-13 MED ORDER — ACETAMINOPHEN 650 MG RE SUPP: 650.0000 mg | Freq: Four times a day (QID) | RECTAL | Status: DC | PRN

## 2019-11-13 MED ORDER — ACETAMINOPHEN 325 MG PO TABS: 650.0000 mg | ORAL_TABLET | Freq: Four times a day (QID) | ORAL | Status: DC | PRN

## 2019-11-13 MED ORDER — CITALOPRAM HYDROBROMIDE 20 MG PO TABS
10.0000 mg | ORAL_TABLET | Freq: Every day | ORAL | Status: DC
  Administered 2019-11-13: 10 mg via ORAL
  Filled 2019-11-13 (×2): qty 1

## 2019-11-13 MED ORDER — ADULT MULTIVITAMIN W/MINERALS CH
1.0000 | ORAL_TABLET | Freq: Every day | ORAL | Status: DC
  Administered 2019-11-13 – 2019-11-14 (×2): 1 via ORAL
  Filled 2019-11-13 (×2): qty 1

## 2019-11-13 MED ORDER — ALPRAZOLAM 1 MG PO TABS
1.0000 mg | ORAL_TABLET | Freq: Three times a day (TID) | ORAL | Status: DC | PRN
  Administered 2019-11-13 – 2019-11-14 (×3): 1 mg via ORAL
  Filled 2019-11-13 (×2): qty 1
  Filled 2019-11-13: qty 2

## 2019-11-13 MED ORDER — SODIUM CHLORIDE 0.9 % IV SOLN: INTRAVENOUS | Status: DC

## 2019-11-13 MED ORDER — ASPIRIN 81 MG PO CHEW
324.0000 mg | CHEWABLE_TABLET | Freq: Once | ORAL | Status: AC
  Administered 2019-11-13: 324 mg via ORAL
  Filled 2019-11-13: qty 4

## 2019-11-13 MED ORDER — TRIAMCINOLONE ACETONIDE 55 MCG/ACT NA AERO
2.0000 | INHALATION_SPRAY | Freq: Every day | NASAL | Status: DC
  Administered 2019-11-13 – 2019-11-14 (×2): 2 via NASAL
  Filled 2019-11-13: qty 21.6

## 2019-11-13 MED ORDER — ONDANSETRON HCL 4 MG/2ML IJ SOLN: 4.0000 mg | Freq: Four times a day (QID) | INTRAMUSCULAR | Status: DC | PRN

## 2019-11-13 MED ORDER — ATORVASTATIN CALCIUM 40 MG PO TABS
40.0000 mg | ORAL_TABLET | Freq: Every day | ORAL | Status: DC
  Administered 2019-11-13 – 2019-11-14 (×2): 40 mg via ORAL
  Filled 2019-11-13 (×2): qty 1

## 2019-11-13 MED ORDER — ONDANSETRON HCL 4 MG PO TABS: 4.0000 mg | ORAL_TABLET | Freq: Four times a day (QID) | ORAL | Status: DC | PRN

## 2019-11-13 MED ORDER — HEPARIN SODIUM (PORCINE) 5000 UNIT/ML IJ SOLN: 60.0000 [IU]/kg | Freq: Once | INTRAMUSCULAR | Status: DC

## 2019-11-13 MED ORDER — SALINE SPRAY 0.65 % NA SOLN
1.0000 | NASAL | Status: DC | PRN
  Administered 2019-11-13: 1 via NASAL
  Filled 2019-11-13: qty 44
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
## 2019-11-13 NOTE — ED Notes (Signed)
Pt is taking sildenafil- no sl nitro per protocol

## 2019-11-13 NOTE — ED Notes (Signed)
Pt provided bedside urinal. MD consulted for pts request for anxiety medication and nasal spray.

## 2019-11-13 NOTE — Plan of Care (Signed)

## 2019-11-13 NOTE — ED Triage Notes (Signed)
Pt presents with c/o chest, neck, and left shoulder pain for approx one week. Pt reports he does work out but started when the pain started. Pt reports the pain is in the upper left part of his chest.

## 2019-11-13 NOTE — Significant Event (Signed)
[facility] STEMI Evaluation  Date: 11/13/19 Time: 9:23 AM  I was contacted by Dr. Billy Fischer regarding possible STEMI.  Mr. Timothy Fuller reportedly presented to the [facility] with a 1 week history of burning chest pain.  EKG shows NSR with subtle ST elevation in leads I, II, V4-V6.  ST elevation is essentially unchanged from prior tracing on 09/29/2016 and is most suggestive of early repolarization.  Given atypical chest pain and aforementioned EKG findings, I do not recommend emergent catheterization at this time.  If dynamic EKG changes develop or significant elevation in troponin is noted, catheterization will need to be reconsidered.  Findings and recommendations were d/w Dr. Billy Fischer.  Nelva Bush, MD [facility]
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
## 2019-11-14 ENCOUNTER — Observation Stay (HOSPITAL_BASED_OUTPATIENT_CLINIC_OR_DEPARTMENT_OTHER): Payer: Managed Care, Other (non HMO)

## 2019-11-14 DIAGNOSIS — I10 Essential (primary) hypertension: Secondary | ICD-10-CM | POA: Diagnosis not present

## 2019-11-14 DIAGNOSIS — F419 Anxiety disorder, unspecified: Secondary | ICD-10-CM | POA: Diagnosis not present

## 2019-11-14 DIAGNOSIS — R079 Chest pain, unspecified: Secondary | ICD-10-CM

## 2019-11-14 DIAGNOSIS — E7800 Pure hypercholesterolemia, unspecified: Secondary | ICD-10-CM | POA: Diagnosis not present

## 2019-11-14 LAB — CBC
HCT: 46.3 % (ref 39.0–52.0)
Hemoglobin: 14.3 g/dL (ref 13.0–17.0)
MCH: 25.7 pg — ABNORMAL LOW (ref 26.0–34.0)
MCHC: 30.9 g/dL (ref 30.0–36.0)
MCV: 83.3 fL (ref 80.0–100.0)
Platelets: 281 10*3/uL (ref 150–400)
RBC: 5.56 MIL/uL (ref 4.22–5.81)
RDW: 14.3 % (ref 11.5–15.5)
WBC: 5.7 10*3/uL (ref 4.0–10.5)
nRBC: 0 % (ref 0.0–0.2)

## 2019-11-14 LAB — ECHOCARDIOGRAM COMPLETE
Height: 73 in
Weight: 3705.6 oz

## 2019-11-14 LAB — HIV ANTIBODY (ROUTINE TESTING W REFLEX): HIV Screen 4th Generation wRfx: NONREACTIVE

## 2019-11-14 MED ORDER — CYCLOBENZAPRINE HCL 5 MG PO TABS: 5.0000 mg | ORAL_TABLET | Freq: Three times a day (TID) | ORAL | 0 refills | Status: DC | PRN

## 2019-11-14 MED ORDER — ATORVASTATIN CALCIUM 40 MG PO TABS: 40.0000 mg | ORAL_TABLET | Freq: Every day | ORAL | 1 refills | Status: DC

## 2019-11-14 NOTE — Discharge Summary (Signed)
Physician Discharge Summary  Timothy Fuller [id number] DOB: 12/04/1965 DOA: 11/13/2019  PCP: Biagio Borg, MD  Admit date: 11/13/2019 Discharge date: 11/14/2019  Admitted From: home Disposition:  Home   Recommendations for Outpatient Follow-up:  1. Follow up with PCP in 1-2 weeks 2. Follow-up with cardiology as an outpatient.  They will arrange appointment  Home Health: none  Equipment/Devices: none   Discharge Condition: stable CODE STATUS: Full code Diet recommendation: regular  HPI: Per admitting MD, Timothy Fuller is a 54 y.o. male with medical history significant of hypertension, dyslipidemia, seizures and anxiety who presents with left precordial chest pain.  He reportes 7 days of constant left precordial chest pain, burning in nature, 7 out of 10 in intensity, radiated to his left neck, worse with exertion improved with rest.  Due to persistent symptoms he came to the hospital for further evaluation. He regularly exercises, goes to the gym about 5 days a week, he does about 30 minutes of treadmill each session.  He has stopped exercising about a week ago due to his chest pain.  ED Course: Patient was evaluated with troponins and EKG, rule out for ST elevation myocardial infarction, but due to risk factors he was referred for further evaluation.  Hospital Course / Discharge diagnoses: Chest pain-patient presented to the hospital with chest pain radiating to his left shoulder and left neck, worse with movement however relieved by NTG. He was initially evaluated as code STEMI due to subtle ST segment changes in I, II, V3-4 however it was felt that these changes are chronic. He was observed overnight, his cardiac markers remained negative and underwent a 2D echo in the morning which was essentially normal. Cardiology evaluated patient and recommended further evaluation and workup as an outpatient. He is stable, will be placed on a muscle relaxer and discharge home in stable condition with  close follow up in cardiology clinic. Essential hypertension - resume home medications on dc Hyperlipidemia - started on statin Anxiety - resume home medications  Discharge Instructions  Allergies as of 11/14/2019      Reactions   Penicillins Nausea Only      Medication List    TAKE these medications   ALPRAZolam 1 MG tablet Commonly known as: XANAX TAKE 1 TABLET(1 MG) BY MOUTH THREE TIMES DAILY AS NEEDED FOR ANXIETY What changed: See the new instructions.   amLODipine 5 MG tablet Commonly known as: NORVASC TAKE 1 TABLET(5 MG) BY MOUTH DAILY What changed: See the new instructions.   atorvastatin 40 MG tablet Commonly known as: LIPITOR Take 1 tablet (40 mg total) by mouth daily. Start taking on: Nov 15, 2019   cetirizine 10 MG tablet Commonly known as: ZYRTEC Take 1 tablet (10 mg total) by mouth daily.   citalopram 10 MG tablet Commonly known as: CeleXA Take 1 tablet (10 mg total) by mouth daily.   cyclobenzaprine 5 MG tablet Commonly known as: FLEXERIL Take 1 tablet (5 mg total) by mouth 3 (three) times daily as needed for muscle spasms.   multivitamin tablet Take 1 tablet by mouth daily.   sildenafil 20 MG tablet Commonly known as: REVATIO Take 3-5 tabs by mouth as needed   triamcinolone 55 MCG/ACT Aero nasal inhaler Commonly known as: NASACORT Place 2 sprays into the nose daily.      Follow-up Information    Donato Heinz, MD Follow up.   Specialty: Cardiology Why: you will be called with an appointment Contact information: [address]  [place] [address] [phone]        Donato Heinz, MD .   Specialty: Cardiology Contact information: [place] [address] [phone]           Consultations:  Cardiology   Procedures/Studies:  [facility] Chest Port 1 View  Result Date: 11/13/2019 CLINICAL DATA:  Chest pain, hypertension EXAM: PORTABLE CHEST 1 VIEW COMPARISON:  09/29/2016  FINDINGS: The heart size and mediastinal contours are within normal limits. Both lungs are clear. The visualized skeletal structures are unremarkable. IMPRESSION: No active disease. Electronically Signed   By: Jerilynn Mages.  Shick M.D.   On: 11/13/2019 09:43   ECHOCARDIOGRAM COMPLETE  Result Date: 11/14/2019    ECHOCARDIOGRAM REPORT   Patient Name:   Timothy Fuller Date of Exam: 11/14/2019 Medical Rec #:  [id number]  Height:       73.0 in Accession #:    [id number] Weight:       231.6 lb Date of Birth:  1965/12/16  BSA:          2.290 m Patient Age:    24 years   BP:           117/78 mmHg Patient Gender: M          HR:           63 bpm. Exam Location:  Inpatient Procedure: 2D Echo Indications:    Chest Pain R07.9  History:        Patient has no prior history of Echocardiogram examinations.                 Risk Factors:Hypertension.  Sonographer:    Mikki Santee RDCS (AE) Referring Phys: [place]  1. Left ventricular ejection fraction, by estimation, is 60 to 65%. The left ventricle has normal function. The left ventricle has no regional wall motion abnormalities. Left ventricular diastolic parameters were normal.  2. Right ventricular systolic function is normal. The right ventricular size is normal.  3. The mitral valve is normal in structure. No evidence of mitral valve regurgitation. No evidence of mitral stenosis.  4. The aortic valve is normal in structure. Aortic valve regurgitation is not visualized. No aortic stenosis is present.  5. The inferior vena cava is normal in size with greater than 50% respiratory variability, suggesting right atrial pressure of 3 mmHg. FINDINGS  Left Ventricle: Left ventricular ejection fraction, by estimation, is 60 to 65%. The left ventricle has normal function. The left ventricle has no regional wall motion abnormalities. The left ventricular internal cavity size was normal in size. There is  no left ventricular hypertrophy. Left ventricular diastolic  parameters were normal. Right Ventricle: The right ventricular size is normal. No increase in right ventricular wall thickness. Right ventricular systolic function is normal. Left Atrium: Left atrial size was normal in size. Right Atrium: Right atrial size was normal in size. Pericardium: There is no evidence of pericardial effusion. Mitral Valve: The mitral valve is normal in structure. Normal mobility of the mitral valve leaflets. No evidence of mitral valve regurgitation. No evidence of mitral valve stenosis. Tricuspid Valve: The tricuspid valve is normal in structure. Tricuspid valve regurgitation is trivial. No evidence of tricuspid stenosis. Aortic Valve: The aortic valve is normal in structure. Aortic valve regurgitation is not visualized. No aortic stenosis is present. Pulmonic Valve: The pulmonic valve was normal in structure. Pulmonic valve regurgitation is trivial. No evidence of pulmonic stenosis. Aorta: The aortic root is normal in  size and structure. Venous: The inferior vena cava is normal in size with greater than 50% respiratory variability, suggesting right atrial pressure of 3 mmHg. IAS/Shunts: No atrial level shunt detected by color flow Doppler.  LEFT VENTRICLE PLAX 2D LVIDd:         4.40 cm  Diastology LVIDs:         2.70 cm  LV e' lateral:   6.96 cm/s LV PW:         1.00 cm  LV E/e' lateral: 9.6 LV IVS:        0.80 cm  LV e' medial:    7.40 cm/s LVOT diam:     2.00 cm  LV E/e' medial:  9.0 LV SV:         73 LV SV Index:   32 LVOT Area:     3.14 cm  RIGHT VENTRICLE RV S prime:     14.50 cm/s TAPSE (M-mode): 1.9 cm LEFT ATRIUM             Index       RIGHT ATRIUM           Index LA diam:        3.10 cm 1.35 cm/m  RA Area:     14.20 cm LA Vol (A2C):   59.7 ml 26.07 ml/m RA Volume:   24.90 ml  10.87 ml/m LA Vol (A4C):   32.2 ml 14.06 ml/m LA Biplane Vol: 46.5 ml 20.31 ml/m  AORTIC VALVE LVOT Vmax:   117.00 cm/s LVOT Vmean:  74.000 cm/s LVOT VTI:    0.232 m  AORTA Ao Root diam: 3.20 cm  MITRAL VALVE MV Area (PHT): 2.42 cm    SHUNTS MV Decel Time: 314 msec    Systemic VTI:  0.23 m MV E velocity: 66.80 cm/s  Systemic Diam: 2.00 cm MV A velocity: 69.40 cm/s MV E/A ratio:  0.96 Jenkins Rouge MD Electronically signed by Jenkins Rouge MD Signature Date/Time: 11/14/2019/10:08:47 AM    Final       Subjective: - no chest pain, shortness of breath, no abdominal pain, nausea or vomiting.   Discharge Exam: BP 122/76 (BP Location: Left Arm)   Pulse 70   Temp 97.9 F (36.6 C) (Oral)   Resp 19   Ht 6\' 1"  (1.854 m)   Wt 105.1 kg   SpO2 96%   BMI 30.56 kg/m   General: Pt is alert, awake, not in acute distress Cardiovascular: RRR, S1/S2 +, no rubs, no gallops Respiratory: CTA bilaterally, no wheezing, no rhonchi Abdominal: Soft, NT, ND, bowel sounds + Extremities: no edema, no cyanosis  The results of significant diagnostics from this hospitalization (including imaging, microbiology, ancillary and laboratory) are listed below for reference.     Microbiology: Recent Results (from the past 240 hour(s))  SARS Coronavirus 2 by RT PCR (hospital order, performed in [facility] hospital lab) Nasopharyngeal Nasopharyngeal Swab     Status: None   Collection Time: 11/13/19  9:24 AM   Specimen: Nasopharyngeal Swab  Result Value Ref Range Status   SARS Coronavirus 2 NEGATIVE NEGATIVE Final    Comment: (NOTE) SARS-CoV-2 target nucleic acids are NOT DETECTED. The SARS-CoV-2 RNA is generally detectable in upper and lower respiratory specimens during the acute phase of infection. The lowest concentration of SARS-CoV-2 viral copies this assay can detect is 250 copies / mL. A negative result does not preclude SARS-CoV-2 infection and should not be used as the sole basis for treatment or other patient  management decisions.  A negative result may occur with improper specimen collection / handling, submission of specimen other than nasopharyngeal swab, presence of viral mutation(s) within  the areas targeted by this assay, and inadequate number of viral copies (<250 copies / mL). A negative result must be combined with clinical observations, patient history, and epidemiological information. Fact Sheet for Patients:   StrictlyIdeas.no Fact Sheet for Healthcare Providers: BankingDealers.co.za This test is not yet approved or cleared  by the [place] FDA and has been authorized for detection and/or diagnosis of SARS-CoV-2 by FDA under an Emergency Use Authorization (EUA).  This EUA will remain in effect (meaning this test can be used) for the duration of the COVID-19 declaration under Section 564(b)(1) of the Act, 21 U.S.C. section 360bbb-3(b)(1), unless the authorization is terminated or revoked sooner. Performed at [facility], [place] [address], [place], [place] [address]      Labs: Basic Metabolic Panel: Recent Labs  Lab 11/13/19 0909 11/13/19 1622  NA 136  --   K 4.3  --   CL 101  --   CO2 28  --   GLUCOSE 102*  --   BUN 16  --   CREATININE 1.02 0.90  CALCIUM 8.8*  --    Liver Function Tests: Recent Labs  Lab 11/13/19 0909  AST 37  ALT 58*  ALKPHOS 46  BILITOT 0.5  PROT 7.0  ALBUMIN 3.8   CBC: Recent Labs  Lab 11/13/19 0909 11/13/19 1622 11/14/19 0545  WBC 5.5 6.0 5.7  NEUTROABS 2.2  --   --   HGB 14.8 15.2 14.3  HCT 46.2 47.6 46.3  MCV 83.2 82.5 83.3  PLT 268 269 281   CBG: No results for input(s): GLUCAP in the last 168 hours. Hgb A1c Recent Labs    11/13/19 0909  HGBA1C 6.1*   Lipid Profile Recent Labs    11/13/19 0909  CHOL 182  HDL 35*  LDLCALC 113*  TRIG 170*  CHOLHDL 5.2   Thyroid function studies No results for input(s): TSH, T4TOTAL, T3FREE, THYROIDAB in the last 72 hours.  Invalid input(s): FREET3 Urinalysis    Component Value Date/Time   COLORURINE YELLOW 12/25/2018 1410   APPEARANCEUR CLEAR 12/25/2018 1410   LABSPEC 1.025 12/25/2018  1410   PHURINE 6.5 12/25/2018 1410   GLUCOSEU NEGATIVE 12/25/2018 1410   HGBUR TRACE-LYSED (A) 12/25/2018 1410   BILIRUBINUR NEGATIVE 12/25/2018 1410   KETONESUR NEGATIVE 12/25/2018 1410   UROBILINOGEN 0.2 12/25/2018 1410   NITRITE NEGATIVE 12/25/2018 1410   LEUKOCYTESUR NEGATIVE 12/25/2018 1410    FURTHER DISCHARGE INSTRUCTIONS:   Get Medicines reviewed and adjusted: Please take all your medications with you for your next visit with your Primary MD   Laboratory/radiological data: Please request your Primary MD to go over all hospital tests and procedure/radiological results at the follow up, please ask your Primary MD to get all Hospital records sent to his/her office.   In some cases, they will be blood work, cultures and biopsy results pending at the time of your discharge. Please request that your primary care M.D. goes through all the records of your hospital data and follows up on these results.   Also Note the following: If you experience worsening of your admission symptoms, develop shortness of breath, life threatening emergency, suicidal or homicidal thoughts you must seek medical attention immediately by calling 911 or calling your MD immediately  if symptoms less severe.   You must read complete instructions/literature along with  all the possible adverse reactions/side effects for all the Medicines you take and that have been prescribed to you. Take any new Medicines after you have completely understood and accpet all the possible adverse reactions/side effects.    Do not drive when taking Pain medications or sleeping medications (Benzodaizepines)   Do not take more than prescribed Pain, Sleep and Anxiety Medications. It is not advisable to combine anxiety,sleep and pain medications without talking with your primary care practitioner   Special Instructions: If you have smoked or chewed Tobacco  in the last 2 yrs please stop smoking, stop any regular Alcohol  and or any  Recreational drug use.   Wear Seat belts while driving.   Please note: You were cared for by a hospitalist during your hospital stay. Once you are discharged, your primary care physician will handle any further medical issues. Please note that NO REFILLS for any discharge medications will be authorized once you are discharged, as it is imperative that you return to your primary care physician (or establish a relationship with a primary care physician if you do not have one) for your post hospital discharge needs so that they can reassess your need for medications and monitor your lab values.  Time coordinating discharge: 25 minutes  SIGNED:  Marzetta Board, MD, PhD 11/14/2019, 10:44 AM

## 2019-11-14 NOTE — Progress Notes (Signed)
Pt complained of 4/10 sharp, left sided chest pain radiating up left side of neck. PRN SL nitro given x2. Pain decreased to 2/10. Cardiology MD Gardiner Rhyme notified at bedside and gave verbal instructions to hold off on giving any more SL nitro at this time. Will continue to monitor.

## 2019-11-14 NOTE — Progress Notes (Signed)
  Echocardiogram 2D Echocardiogram has been performed.  Jennette Dubin 11/14/2019, 8:57 AM

## 2019-11-14 NOTE — Progress Notes (Signed)
Pt c/o mid chest pain radiating to the left side of his neck. Asking for PRN nitro. EKG and VS obtained. Nitro given x2 with relief of chest pain. Another EKG taken upon relief of chest pain. Xanax also given per pt request. Will obtain 0500 EKG as ordered.

## 2019-11-14 NOTE — Progress Notes (Signed)
IV removed. Discharge instructions reviewed with patient and all questions answered. Pt discharged home in stable condition.
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
## 2019-11-14 NOTE — Discharge Instructions (Signed)
Follow with Biagio Borg, MD in 5-7 days  Follow up with Dr Gardiner Rhyme with cardiology. His office will call to schedule an apointment  Please get a complete blood count and chemistry panel checked by your Primary MD at your next visit, and again as instructed by your Primary MD. Please get your medications reviewed and adjusted by your Primary MD.  Please request your Primary MD to go over all Hospital Tests and Procedure/Radiological results at the follow up, please get all Hospital records sent to your Prim MD by signing hospital release before you go home.  In some cases, there will be blood work, cultures and biopsy results pending at the time of your discharge. Please request that your primary care M.D. goes through all the records of your hospital data and follows up on these results.  If you had Pneumonia of Lung problems at the Hospital: Please get a 2 view Chest X ray done in 6-8 weeks after hospital discharge or sooner if instructed by your Primary MD.  If you have Congestive Heart Failure: Please call your Cardiologist or Primary MD anytime you have any of the following symptoms:  1) 3 pound weight gain in 24 hours or 5 pounds in 1 week  2) shortness of breath, with or without a dry hacking cough  3) swelling in the hands, feet or stomach  4) if you have to sleep on extra pillows at night in order to breathe  Follow cardiac low salt diet and 1.5 lit/day fluid restriction.  If you have diabetes Accuchecks 4 times/day, Once in AM empty stomach and then before each meal. Log in all results and show them to your primary doctor at your next visit. If any glucose reading is under 80 or above 300 call your primary MD immediately.  If you have Seizure/Convulsions/Epilepsy: Please do not drive, operate heavy machinery, participate in activities at heights or participate in high speed sports until you have seen by Primary MD or a Neurologist and advised to do so again. Per [facility] statutes, patients with seizures are not allowed to drive until they have been seizure-free for six months.  Use caution when using heavy equipment or power tools. Avoid working on ladders or at heights. Take showers instead of baths. Ensure the water temperature is not too high on the home water heater. Do not go swimming alone. Do not lock yourself in a room alone (i.e. bathroom). When caring for infants or small children, sit down when holding, feeding, or changing them to minimize risk of injury to the child in the event you have a seizure. Maintain good sleep hygiene. Avoid alcohol.   If you had Gastrointestinal Bleeding: Please ask your Primary MD to check a complete blood count within one week of discharge or at your next visit. Your endoscopic/colonoscopic biopsies that are pending at the time of discharge, will also need to followed by your Primary MD.  Get Medicines reviewed and adjusted. Please take all your medications with you for your next visit with your Primary MD  Please request your Primary MD to go over all hospital tests and procedure/radiological results at the follow up, please ask your Primary MD to get all Hospital records sent to his/her office.  If you experience worsening of your admission symptoms, develop shortness of breath, life threatening emergency, suicidal or homicidal thoughts you must seek medical attention immediately by calling 911 or calling your MD immediately  if symptoms less severe.  You must  read complete instructions/literature along with all the possible adverse reactions/side effects for all the Medicines you take and that have been prescribed to you. Take any new Medicines after you have completely understood and accpet all the possible adverse reactions/side effects.   Do not drive or operate heavy machinery when taking Pain medications.   Do not take more than prescribed Pain, Sleep and Anxiety Medications  Special Instructions: If  you have smoked or chewed Tobacco  in the last 2 yrs please stop smoking, stop any regular Alcohol  and or any Recreational drug use.  Wear Seat belts while driving.  Please note You were cared for by a hospitalist during your hospital stay. If you have any questions about your discharge medications or the care you received while you were in the hospital after you are discharged, you can call the unit and asked to speak with the hospitalist on call if the hospitalist that took care of you is not available. Once you are discharged, your primary care physician will handle any further medical issues. Please note that NO REFILLS for any discharge medications will be authorized once you are discharged, as it is imperative that you return to your primary care physician (or establish a relationship with a primary care physician if you do not have one) for your aftercare needs so that they can reassess your need for medications and monitor your lab values.  You can reach the hospitalist office at phone [phone] or fax [phone]   If you do not have a primary care physician, you can call [phone] for a physician referral.  Activity: As tolerated with Full fall precautions use walker/cane & assistance as needed    Diet: regular  Disposition Home

## 2019-11-15 ENCOUNTER — Telehealth: Payer: Self-pay | Admitting: *Deleted

## 2019-11-15 NOTE — Telephone Encounter (Signed)
Pt was on the TCM report admitted 11/13/19 with chest pain radiating to his left shoulder and left neck, worse with movement however relieved by NTG. He was initially evaluated as code STEMI due to subtle ST segment changes in I, II, V3-4 however it was felt that these changes are chronic. He was observed overnight, his cardiac markers remained negative and underwent a 2D echo in the morning which was essentially normal. Cardiology evaluated patient and recommended further evaluation and workup as an outpatient. Pt D/C 5/16/21and will f/u w/cardiology 11/23/19.Marland KitchenJohny Chess

## 2019-11-23 ENCOUNTER — Ambulatory Visit: Payer: Managed Care, Other (non HMO) | Admitting: Cardiology
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
## 2019-12-02 ENCOUNTER — Telehealth: Payer: Self-pay | Admitting: Cardiology

## 2019-12-02 NOTE — Telephone Encounter (Signed)
I called patient 12/02/19 to schedule appointment for initial visit due to new referral sent over. The patient didn't answer so I left message for patient to return call in order to get that scheduled.

## 2019-12-08 ENCOUNTER — Encounter: Payer: Self-pay | Admitting: General Practice

## 2019-12-27 ENCOUNTER — Other Ambulatory Visit: Payer: Self-pay | Admitting: Internal Medicine

## 2019-12-28 ENCOUNTER — Encounter: Payer: Self-pay | Admitting: Internal Medicine

## 2019-12-28 ENCOUNTER — Other Ambulatory Visit: Payer: Self-pay

## 2019-12-28 ENCOUNTER — Ambulatory Visit (INDEPENDENT_AMBULATORY_CARE_PROVIDER_SITE_OTHER): Payer: Managed Care, Other (non HMO) | Admitting: Internal Medicine

## 2019-12-28 VITALS — BP 140/90 | HR 73 | Temp 97.6°F | Ht 73.0 in | Wt 229.0 lb

## 2019-12-28 DIAGNOSIS — R739 Hyperglycemia, unspecified: Secondary | ICD-10-CM

## 2019-12-28 DIAGNOSIS — F419 Anxiety disorder, unspecified: Secondary | ICD-10-CM

## 2019-12-28 DIAGNOSIS — E559 Vitamin D deficiency, unspecified: Secondary | ICD-10-CM

## 2019-12-28 DIAGNOSIS — Z1159 Encounter for screening for other viral diseases: Secondary | ICD-10-CM

## 2019-12-28 DIAGNOSIS — E7800 Pure hypercholesterolemia, unspecified: Secondary | ICD-10-CM

## 2019-12-28 DIAGNOSIS — E538 Deficiency of other specified B group vitamins: Secondary | ICD-10-CM | POA: Diagnosis not present

## 2019-12-28 DIAGNOSIS — Z0000 Encounter for general adult medical examination without abnormal findings: Secondary | ICD-10-CM

## 2019-12-28 LAB — LIPID PANEL
Cholesterol: 143 mg/dL (ref 0–200)
HDL: 33.5 mg/dL — ABNORMAL LOW (ref >39.00)
LDL Cholesterol: 78 mg/dL (ref 0–99)
NonHDL: 109.1
Total CHOL/HDL Ratio: 4
Triglycerides: 155 mg/dL — ABNORMAL HIGH (ref 0.0–149.0)
VLDL: 31 mg/dL (ref 0.0–40.0)

## 2019-12-28 LAB — PSA: PSA: 0.36 ng/mL (ref 0.10–4.00)

## 2019-12-28 LAB — BASIC METABOLIC PANEL
BUN: 19 mg/dL (ref 6–23)
CO2: 27 mEq/L (ref 19–32)
Calcium: 9.4 mg/dL (ref 8.4–10.5)
Chloride: 103 mEq/L (ref 96–112)
Creatinine, Ser: 1.1 mg/dL (ref 0.40–1.50)
GFR: 85.02 mL/min (ref >60.00)
Glucose, Bld: 97 mg/dL (ref 70–99)
Potassium: 4.3 mEq/L (ref 3.5–5.1)
Sodium: 137 mEq/L (ref 135–145)

## 2019-12-28 LAB — CBC WITH DIFFERENTIAL/PLATELET
Basophils Absolute: 0 10*3/uL (ref 0.0–0.1)
Basophils Relative: 0.6 % (ref 0.0–3.0)
Eosinophils Absolute: 0.1 10*3/uL (ref 0.0–0.7)
Eosinophils Relative: 2.1 % (ref 0.0–5.0)
HCT: 42.6 % (ref 39.0–52.0)
Hemoglobin: 13.9 g/dL (ref 13.0–17.0)
Lymphocytes Relative: 53.2 % — ABNORMAL HIGH (ref 12.0–46.0)
Lymphs Abs: 2.9 10*3/uL (ref 0.7–4.0)
MCHC: 32.6 g/dL (ref 30.0–36.0)
MCV: 79.7 fl (ref 78.0–100.0)
Monocytes Absolute: 0.5 10*3/uL (ref 0.1–1.0)
Monocytes Relative: 9.3 % (ref 3.0–12.0)
Neutro Abs: 1.9 10*3/uL (ref 1.4–7.7)
Neutrophils Relative %: 34.8 % — ABNORMAL LOW (ref 43.0–77.0)
Platelets: 296 10*3/uL (ref 150.0–400.0)
RBC: 5.35 Mil/uL (ref 4.22–5.81)
RDW: 15 % (ref 11.5–15.5)
WBC: 5.5 10*3/uL (ref 4.0–10.5)

## 2019-12-28 LAB — VITAMIN D 25 HYDROXY (VIT D DEFICIENCY, FRACTURES): VITD: 36.74 ng/mL (ref 30.00–100.00)

## 2019-12-28 LAB — TSH: TSH: 1.89 u[IU]/mL (ref 0.35–4.50)

## 2019-12-28 LAB — HEPATIC FUNCTION PANEL
ALT: 47 U/L (ref 0–53)
AST: 38 U/L — ABNORMAL HIGH (ref 0–37)
Albumin: 4.2 g/dL (ref 3.5–5.2)
Alkaline Phosphatase: 52 U/L (ref 39–117)
Bilirubin, Direct: 0.1 mg/dL (ref 0.0–0.3)
Total Bilirubin: 0.3 mg/dL (ref 0.2–1.2)
Total Protein: 6.9 g/dL (ref 6.0–8.3)

## 2019-12-28 LAB — URINALYSIS, ROUTINE W REFLEX MICROSCOPIC
Bilirubin Urine: NEGATIVE
Ketones, ur: NEGATIVE
Leukocytes,Ua: NEGATIVE
Nitrite: NEGATIVE
Specific Gravity, Urine: 1.025 (ref 1.000–1.030)
Total Protein, Urine: NEGATIVE
Urine Glucose: NEGATIVE
Urobilinogen, UA: 0.2 (ref 0.0–1.0)
pH: 5.5 (ref 5.0–8.0)

## 2019-12-28 LAB — VITAMIN B12: Vitamin B-12: 301 pg/mL (ref 211–911)

## 2019-12-28 LAB — HEMOGLOBIN A1C: Hgb A1c MFr Bld: 6 % (ref 4.6–6.5)

## 2019-12-28 MED ORDER — ATORVASTATIN CALCIUM 40 MG PO TABS: 40.0000 mg | ORAL_TABLET | Freq: Every day | ORAL | 3 refills | Status: DC

## 2019-12-28 MED ORDER — CYCLOBENZAPRINE HCL 5 MG PO TABS: 5.0000 mg | ORAL_TABLET | Freq: Three times a day (TID) | ORAL | 2 refills | Status: DC | PRN

## 2019-12-28 MED ORDER — ALPRAZOLAM 1 MG PO TABS: ORAL_TABLET | ORAL | 2 refills | Status: DC

## 2019-12-28 NOTE — Assessment & Plan Note (Signed)

## 2019-12-28 NOTE — Addendum Note (Signed)
Addended by: Biagio Borg on: 12/28/2019 08:48 AM   Modules accepted: Orders
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
## 2019-12-28 NOTE — Assessment & Plan Note (Signed)
stable overall by history and exam, recent data reviewed with pt, and pt to continue medical treatment as before,  to f/u any worsening symptoms or concerns  

## 2019-12-28 NOTE — Assessment & Plan Note (Signed)
On new statin, for f/u lab 

## 2019-12-28 NOTE — Patient Instructions (Addendum)

## 2019-12-28 NOTE — Assessment & Plan Note (Signed)
For f/u lab 

## 2019-12-28 NOTE — Addendum Note (Signed)
Addended by: Trenda Moots on: 1/58/3094 08:48 AM   Modules accepted: Orders

## 2019-12-29 LAB — HEPATITIS C ANTIBODY
Hepatitis C Ab: NONREACTIVE
SIGNAL TO CUT-OFF: 0.01 (ref <1.00)

## 2020-01-26 ENCOUNTER — Other Ambulatory Visit: Payer: Self-pay

## 2020-01-26 MED ORDER — AMLODIPINE BESYLATE 5 MG PO TABS: ORAL_TABLET | ORAL | 1 refills | Status: DC

## 2020-01-28 ENCOUNTER — Telehealth: Payer: Self-pay | Admitting: Internal Medicine

## 2020-01-28 NOTE — Telephone Encounter (Signed)
    Patient refill on  ALPRAZolam Duanne Moron) 1 MG tablet [facility] [place], [place]

## 2020-01-31 NOTE — Telephone Encounter (Signed)
Sent to Dr. John. 

## 2020-01-31 NOTE — Telephone Encounter (Signed)
No refill needed - already done June 29

## 2020-02-08 ENCOUNTER — Other Ambulatory Visit: Payer: Self-pay | Admitting: Internal Medicine

## 2020-02-08 NOTE — Telephone Encounter (Signed)
New message:   1.Medication Requested: cyclobenzaprine (FLEXERIL) 5 MG tablet  ALPRAZolam (XANAX) 1 MG tablet 2. Pharmacy (Name, [place]): [place], [place] 3. On Med List: yes  4. Last Visit with PCP: 12/28/19  5. Next visit date with PCP: 12/28/20  90 day supply up to 3 refills for both prescriptions.   Agent: Please be advised that RX refills may take up to 3 business days. We ask that you follow-up with your pharmacy.

## 2020-02-09 MED ORDER — CYCLOBENZAPRINE HCL 5 MG PO TABS: 5.0000 mg | ORAL_TABLET | Freq: Three times a day (TID) | ORAL | 1 refills | Status: DC | PRN

## 2020-02-09 MED ORDER — ALPRAZOLAM 1 MG PO TABS: ORAL_TABLET | ORAL | 2 refills | Status: DC

## 2020-02-09 NOTE — Telephone Encounter (Signed)
Xanax done erx [facility]  Flex[facility]il [facility]

## 2020-02-09 NOTE — Telephone Encounter (Signed)
Sent to Dr. John. 

## 2020-02-10 NOTE — Telephone Encounter (Signed)
Message left for patient

## 2020-04-05 ENCOUNTER — Other Ambulatory Visit: Payer: Self-pay | Admitting: Internal Medicine

## 2020-04-05 NOTE — Telephone Encounter (Signed)
Xanax too soon - already has 3 mo rx that was to be filled aug 26

## 2020-04-10 ENCOUNTER — Ambulatory Visit: Payer: Managed Care, Other (non HMO) | Admitting: Pulmonary Disease

## 2020-05-01 ENCOUNTER — Encounter: Payer: Self-pay | Admitting: Pulmonary Disease

## 2020-05-01 ENCOUNTER — Ambulatory Visit: Payer: Managed Care, Other (non HMO) | Admitting: Pulmonary Disease

## 2020-05-01 ENCOUNTER — Other Ambulatory Visit: Payer: Self-pay

## 2020-05-01 VITALS — BP 114/70 | HR 78 | Temp 98.0°F | Ht 73.0 in | Wt 233.4 lb

## 2020-05-01 DIAGNOSIS — G4733 Obstructive sleep apnea (adult) (pediatric): Secondary | ICD-10-CM | POA: Diagnosis not present

## 2020-05-01 DIAGNOSIS — Z9989 Dependence on other enabling machines and devices: Secondary | ICD-10-CM | POA: Diagnosis not present

## 2020-05-01 NOTE — Patient Instructions (Signed)
Continue CPAP on a nightly basis  Continue using Nasonex or any other nasal steroid -Usual doses 2 sprays nightly/or once a day  Allergy medications like Allegra-D, Zyrtec-D -Any other antihistamine with a decongestant will be of benefit  If these do not control your symptoms, addition of Singulair may be considered  I will see you in 6 months  Call with any significant concerns  Continue to use CPAP on a nightly basis
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
## 2020-05-11 ENCOUNTER — Other Ambulatory Visit: Payer: Self-pay | Admitting: Internal Medicine

## 2020-05-15 ENCOUNTER — Other Ambulatory Visit: Payer: Self-pay | Admitting: Family

## 2020-05-15 ENCOUNTER — Telehealth (INDEPENDENT_AMBULATORY_CARE_PROVIDER_SITE_OTHER): Payer: Managed Care, Other (non HMO) | Admitting: Family

## 2020-05-15 DIAGNOSIS — R112 Nausea with vomiting, unspecified: Secondary | ICD-10-CM

## 2020-05-15 MED ORDER — ONDANSETRON 4 MG PO TBDP
4.0000 mg | ORAL_TABLET | Freq: Three times a day (TID) | ORAL | 0 refills | Status: DC | PRN
Start: 2020-05-15 — End: 2021-01-15
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
## 2020-05-17 ENCOUNTER — Telehealth: Payer: Self-pay | Admitting: Internal Medicine

## 2020-05-17 ENCOUNTER — Telehealth: Payer: Self-pay

## 2020-05-17 NOTE — Telephone Encounter (Signed)
Work note faxed to [phone]

## 2020-05-17 NOTE — Telephone Encounter (Signed)
    Patient unable to print work note from [employer]  Please fax to [phone], Attn: Melissa at [facility]

## 2020-06-16 ENCOUNTER — Other Ambulatory Visit: Payer: Self-pay | Admitting: Internal Medicine

## 2020-07-07 ENCOUNTER — Other Ambulatory Visit: Payer: Self-pay | Admitting: Internal Medicine

## 2020-07-10 ENCOUNTER — Telehealth: Payer: Self-pay | Admitting: Internal Medicine

## 2020-07-10 NOTE — Telephone Encounter (Signed)
   Patient requesting medication for thrush He states he has a sore throat and his girlfriend is being treating for thrush as well  Please advise

## 2020-07-11 MED ORDER — NYSTATIN 100000 UNIT/ML MT SUSP: 500000.0000 [IU] | Freq: Four times a day (QID) | OROMUCOSAL | 2 refills | Status: AC

## 2020-07-11 NOTE — Telephone Encounter (Signed)
Timothy Fuller is not a problem you get from anohter person  But we can try - nystatin soln - asd done erx

## 2020-07-11 NOTE — Telephone Encounter (Signed)
Please advise.   Thanks. Dm/cma  

## 2020-07-11 NOTE — Telephone Encounter (Signed)
lft VM to rtn call. Dm/cma  

## 2020-08-02 IMAGING — DX DG CHEST 1V PORT
1 series · 1 of 1 positions shown · non-contrast
Comparison: 09/29/2016

CLINICAL DATA: Chest pain, hypertension

EXAM:
PORTABLE CHEST 1 VIEW

[chest ap]
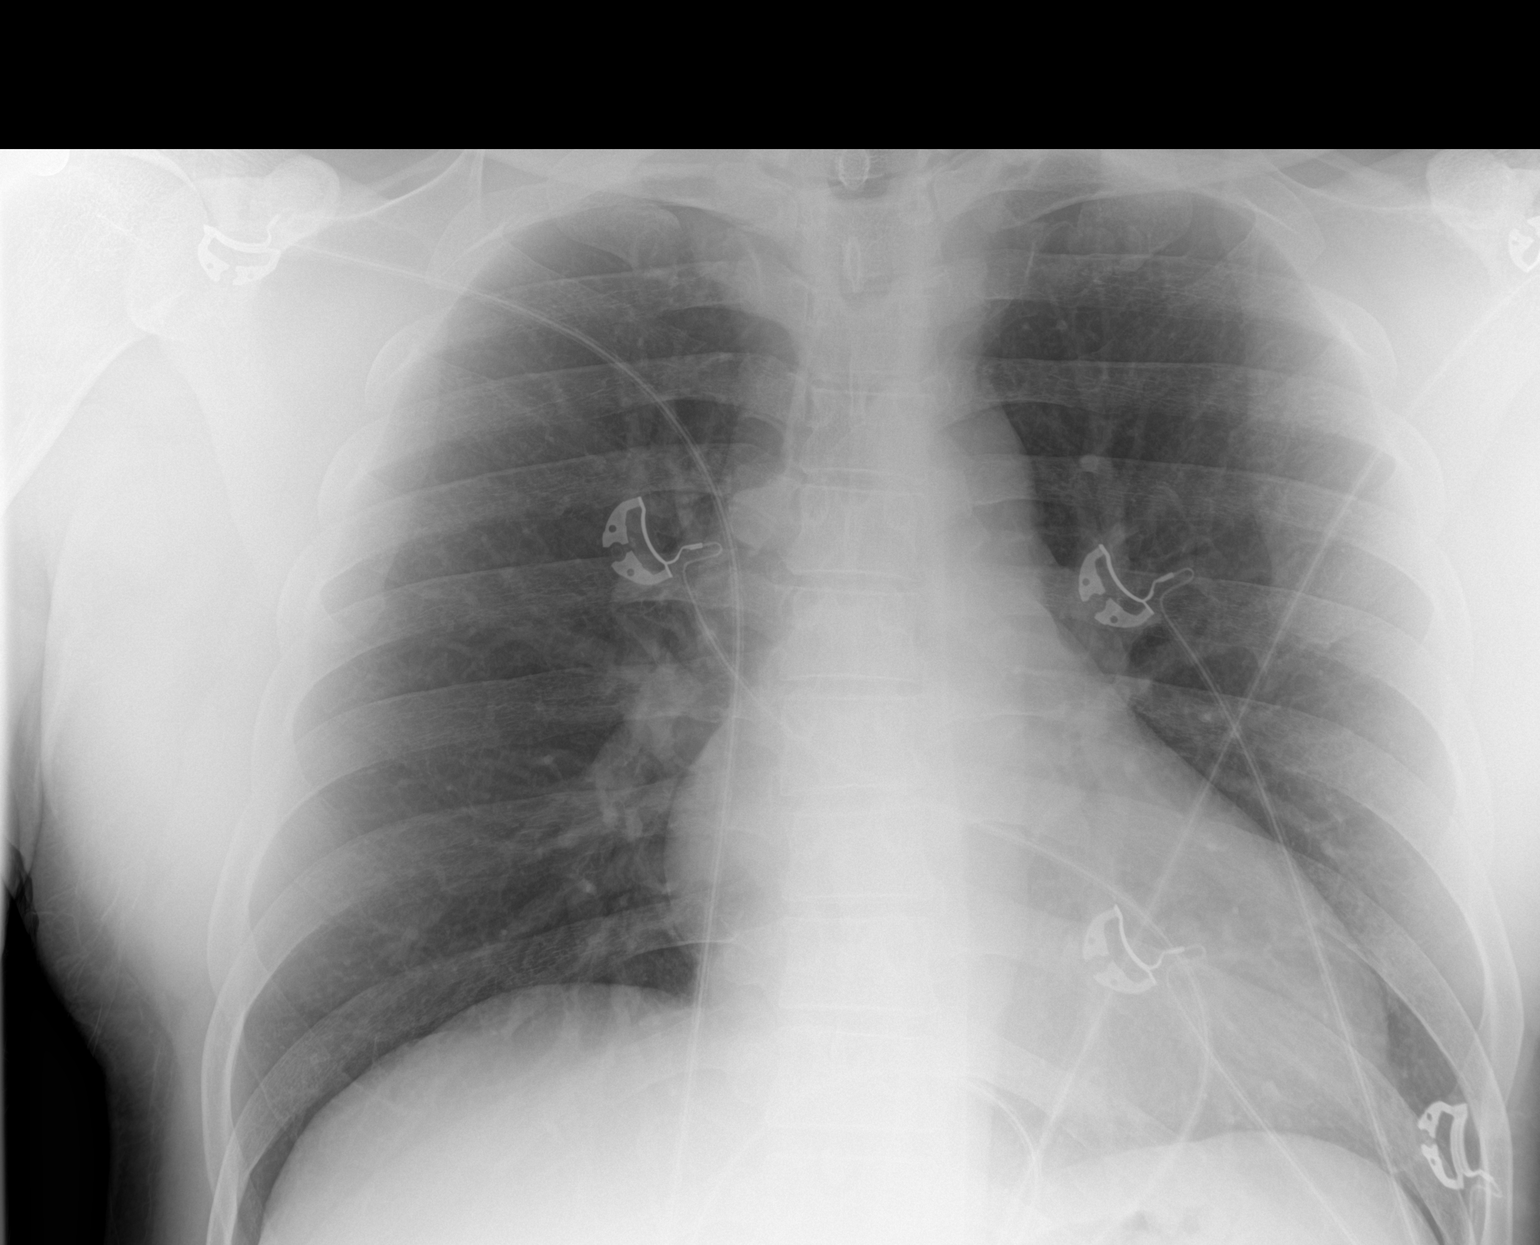

[1 of 1 positions shown; findings below may reference images not displayed]

FINDINGS: The heart size and mediastinal contours are within normal limits.
Both lungs are clear. The visualized skeletal structures are
unremarkable.
IMPRESSION: No active disease.

## 2020-09-22 ENCOUNTER — Other Ambulatory Visit: Payer: Self-pay | Admitting: Internal Medicine

## 2020-09-22 NOTE — Telephone Encounter (Signed)
Please refill as per office routine med refill policy (all routine meds refilled for 3 mo or monthly per pt preference up to one year from last visit, then month to month grace period for 3 mo, then further med refills will have to be denied)  

## 2020-09-24 ENCOUNTER — Other Ambulatory Visit: Payer: Self-pay | Admitting: Internal Medicine

## 2020-11-30 ENCOUNTER — Encounter (HOSPITAL_COMMUNITY): Payer: Self-pay

## 2020-11-30 ENCOUNTER — Ambulatory Visit (HOSPITAL_COMMUNITY)
Admission: EM | Admit: 2020-11-30 | Discharge: 2020-11-30 | Disposition: A | Discharge status: Home / Self Care | Payer: Managed Care, Other (non HMO) | Attending: Urgent Care | Admitting: Urgent Care

## 2020-11-30 ENCOUNTER — Other Ambulatory Visit: Payer: Self-pay

## 2020-11-30 DIAGNOSIS — G44209 Tension-type headache, unspecified, not intractable: Secondary | ICD-10-CM

## 2020-11-30 MED ORDER — EXCEDRIN MIGRAINE 250-250-65 MG PO TABS: 1.0000 | ORAL_TABLET | Freq: Three times a day (TID) | ORAL | 0 refills | Status: DC | PRN

## 2020-11-30 NOTE — ED Triage Notes (Signed)
Pt presents with ongoing headache since yesterday that is unrelieved with OTC medication.
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
## 2020-12-28 ENCOUNTER — Encounter: Payer: Managed Care, Other (non HMO) | Admitting: Internal Medicine

## 2021-01-09 ENCOUNTER — Other Ambulatory Visit: Payer: Self-pay | Admitting: Internal Medicine

## 2021-01-09 NOTE — Telephone Encounter (Signed)
Please refill as per office routine med refill policy (all routine meds refilled for 3 mo or monthly per pt preference up to one year from last visit, then month to month grace period for 3 mo, then further med refills will have to be denied)  

## 2021-01-11 ENCOUNTER — Other Ambulatory Visit: Payer: Self-pay

## 2021-01-11 MED ORDER — ATORVASTATIN CALCIUM 40 MG PO TABS: 40.0000 mg | ORAL_TABLET | Freq: Every day | ORAL | 0 refills | Status: DC

## 2021-01-15 ENCOUNTER — Other Ambulatory Visit: Payer: Self-pay

## 2021-01-15 ENCOUNTER — Encounter (HOSPITAL_COMMUNITY): Payer: Self-pay

## 2021-01-15 ENCOUNTER — Ambulatory Visit (HOSPITAL_COMMUNITY)
Admission: EM | Admit: 2021-01-15 | Discharge: 2021-01-15 | Disposition: A | Discharge status: Home / Self Care | Payer: Managed Care, Other (non HMO) | Attending: Internal Medicine | Admitting: Internal Medicine

## 2021-01-15 DIAGNOSIS — T148XXA Other injury of unspecified body region, initial encounter: Secondary | ICD-10-CM
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
## 2021-01-15 NOTE — Discharge Instructions (Addendum)
Apply topical antibiotic ointment to the area affected If you notice any worsening symptoms please return to urgent care to be reevaluated.
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
## 2021-01-17 ENCOUNTER — Encounter: Payer: Managed Care, Other (non HMO) | Admitting: Internal Medicine

## 2021-01-31 ENCOUNTER — Encounter: Payer: Managed Care, Other (non HMO) | Admitting: Internal Medicine

## 2021-02-08 ENCOUNTER — Other Ambulatory Visit: Payer: Self-pay

## 2021-02-08 ENCOUNTER — Ambulatory Visit (INDEPENDENT_AMBULATORY_CARE_PROVIDER_SITE_OTHER): Payer: Managed Care, Other (non HMO) | Admitting: Internal Medicine

## 2021-02-08 ENCOUNTER — Encounter: Payer: Self-pay | Admitting: Internal Medicine

## 2021-02-08 VITALS — BP 138/86 | HR 77 | Temp 98.1°F | Ht 73.0 in | Wt 225.0 lb

## 2021-02-08 DIAGNOSIS — E559 Vitamin D deficiency, unspecified: Secondary | ICD-10-CM | POA: Diagnosis not present

## 2021-02-08 DIAGNOSIS — R739 Hyperglycemia, unspecified: Secondary | ICD-10-CM | POA: Diagnosis not present

## 2021-02-08 DIAGNOSIS — E7800 Pure hypercholesterolemia, unspecified: Secondary | ICD-10-CM | POA: Diagnosis not present

## 2021-02-08 DIAGNOSIS — Z0000 Encounter for general adult medical examination without abnormal findings: Secondary | ICD-10-CM

## 2021-02-08 DIAGNOSIS — Z0001 Encounter for general adult medical examination with abnormal findings: Secondary | ICD-10-CM

## 2021-02-08 DIAGNOSIS — I10 Essential (primary) hypertension: Secondary | ICD-10-CM

## 2021-02-08 DIAGNOSIS — Z125 Encounter for screening for malignant neoplasm of prostate: Secondary | ICD-10-CM

## 2021-02-08 LAB — URINALYSIS, ROUTINE W REFLEX MICROSCOPIC
Bilirubin Urine: NEGATIVE
Ketones, ur: NEGATIVE
Leukocytes,Ua: NEGATIVE
Nitrite: NEGATIVE
Specific Gravity, Urine: 1.01 (ref 1.000–1.030)
Total Protein, Urine: NEGATIVE
Urine Glucose: NEGATIVE
Urobilinogen, UA: 0.2 (ref 0.0–1.0)
WBC, UA: NONE SEEN (ref 0–?)
pH: 6 (ref 5.0–8.0)

## 2021-02-08 LAB — LIPID PANEL
Cholesterol: 181 mg/dL (ref 0–200)
HDL: 41.2 mg/dL (ref >39.00)
LDL Cholesterol: 115 mg/dL — ABNORMAL HIGH (ref 0–99)
NonHDL: 139.63
Total CHOL/HDL Ratio: 4
Triglycerides: 121 mg/dL (ref 0.0–149.0)
VLDL: 24.2 mg/dL (ref 0.0–40.0)

## 2021-02-08 LAB — BASIC METABOLIC PANEL
BUN: 18 mg/dL (ref 6–23)
CO2: 30 mEq/L (ref 19–32)
Calcium: 9.7 mg/dL (ref 8.4–10.5)
Chloride: 103 mEq/L (ref 96–112)
Creatinine, Ser: 1.06 mg/dL (ref 0.40–1.50)
GFR: 80.28 mL/min (ref >60.00)
Glucose, Bld: 97 mg/dL (ref 70–99)
Potassium: 4.5 mEq/L (ref 3.5–5.1)
Sodium: 138 mEq/L (ref 135–145)

## 2021-02-08 LAB — HEPATIC FUNCTION PANEL
ALT: 30 U/L (ref 0–53)
AST: 25 U/L (ref 0–37)
Albumin: 4.2 g/dL (ref 3.5–5.2)
Alkaline Phosphatase: 43 U/L (ref 39–117)
Bilirubin, Direct: 0.1 mg/dL (ref 0.0–0.3)
Total Bilirubin: 0.5 mg/dL (ref 0.2–1.2)
Total Protein: 7.1 g/dL (ref 6.0–8.3)

## 2021-02-08 LAB — CBC WITH DIFFERENTIAL/PLATELET
Basophils Absolute: 0 10*3/uL (ref 0.0–0.1)
Basophils Relative: 0.5 % (ref 0.0–3.0)
Eosinophils Absolute: 0.1 10*3/uL (ref 0.0–0.7)
Eosinophils Relative: 2.1 % (ref 0.0–5.0)
HCT: 45.8 % (ref 39.0–52.0)
Hemoglobin: 15.1 g/dL (ref 13.0–17.0)
Lymphocytes Relative: 42.5 % (ref 12.0–46.0)
Lymphs Abs: 2.3 10*3/uL (ref 0.7–4.0)
MCHC: 32.9 g/dL (ref 30.0–36.0)
MCV: 81.6 fl (ref 78.0–100.0)
Monocytes Absolute: 0.5 10*3/uL (ref 0.1–1.0)
Monocytes Relative: 9.3 % (ref 3.0–12.0)
Neutro Abs: 2.5 10*3/uL (ref 1.4–7.7)
Neutrophils Relative %: 45.6 % (ref 43.0–77.0)
Platelets: 284 10*3/uL (ref 150.0–400.0)
RBC: 5.6 Mil/uL (ref 4.22–5.81)
RDW: 14.7 % (ref 11.5–15.5)
WBC: 5.5 10*3/uL (ref 4.0–10.5)

## 2021-02-08 LAB — PSA: PSA: 0.43 ng/mL (ref 0.10–4.00)

## 2021-02-08 LAB — HEMOGLOBIN A1C: Hgb A1c MFr Bld: 6 % (ref 4.6–6.5)

## 2021-02-08 LAB — VITAMIN D 25 HYDROXY (VIT D DEFICIENCY, FRACTURES): VITD: 29.07 ng/mL — ABNORMAL LOW (ref 30.00–100.00)

## 2021-02-08 LAB — TSH: TSH: 1.62 u[IU]/mL (ref 0.35–5.50)
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
## 2021-02-08 NOTE — Patient Instructions (Addendum)
Please check with your insurance about the Shingles shot coverage  Please take OTC Vitamin D3 at 2000 units per day, indefinitely  We have discussed the Cardiac CT Score test to measure the calcification level (if any) in your heart arteries.  This test has been ordered in our [place], so please call  CT directly, as they prefer this, at [phone] to be scheduled.  Please continue all other medications as before, and refills have been done if requested.  Please have the pharmacy call with any other refills you may need.  Please continue your efforts at being more active, low cholesterol diet, and weight control.  You are otherwise up to date with prevention measures today.  Please keep your appointments with your specialists as you may have planned  Please go to the LAB at the blood drawing area for the tests to be done  You will be contacted by phone if any changes need to be made immediately.  Otherwise, you will receive a letter about your results with an explanation, but please check with MyChart first.  Please remember to sign up for MyChart if you have not done so, as this will be important to you in the future with finding out test results, communicating by private email, and scheduling acute appointments online when needed.  Please make an Appointment to return for your 1 year visit, or sooner if needed

## 2021-02-08 NOTE — Assessment & Plan Note (Addendum)
Last vitamin D Lab Results  Component Value Date   VD25OH 29.07 (L) 02/08/2021   Low, to start oral replacement  

## 2021-02-11 ENCOUNTER — Other Ambulatory Visit: Payer: Self-pay | Admitting: Internal Medicine

## 2021-02-12 ENCOUNTER — Other Ambulatory Visit: Payer: Self-pay | Admitting: Internal Medicine

## 2021-02-13 ENCOUNTER — Encounter: Payer: Self-pay | Admitting: Internal Medicine

## 2021-02-13 NOTE — Assessment & Plan Note (Signed)
Age and sex appropriate education and counseling updated with regular exercise and diet Referrals for preventative services - none needed Immunizations addressed - declines covid booster, flu shot, shignrix Smoking counseling  - none needed Evidence for depression or other mood disorder - none significant Most recent labs reviewed. I have personally reviewed and have noted: 1) the patient's medical and social history 2) The patient's current medications and supplements 3) The patient's height, weight, and BMI have been recorded in the chart

## 2021-02-13 NOTE — Assessment & Plan Note (Signed)
BP Readings from Last 3 Encounters:  02/08/21 138/86  01/15/21 129/80  11/30/20 131/80   Stable, pt to continue medical treatment norvasc

## 2021-02-13 NOTE — Assessment & Plan Note (Signed)
Lab Results  Component Value Date   LDLCALC 115 (H) 02/08/2021   Uncontrolled, goal ldl < 100, pt to continue current statin lipitor 40 per pt, and work on better low chol diet

## 2021-02-13 NOTE — Assessment & Plan Note (Signed)
Lab Results  Component Value Date   HGBA1C 6.0 02/08/2021   Stable, pt to continue current medical treatment  - diet

## 2021-02-23 ENCOUNTER — Encounter: Payer: Self-pay | Admitting: Internal Medicine

## 2021-03-17 ENCOUNTER — Other Ambulatory Visit: Payer: Self-pay | Admitting: Internal Medicine

## 2021-03-17 NOTE — Telephone Encounter (Signed)
Please refill as per office routine med refill policy (all routine meds to be refilled for 3 mo or monthly (per pt preference) up to one year from last visit, then month to month grace period for 3 mo, then further med refills will have to be denied) ? ?

## 2021-05-10 ENCOUNTER — Other Ambulatory Visit: Payer: Self-pay | Admitting: Internal Medicine

## 2021-05-16 ENCOUNTER — Other Ambulatory Visit: Payer: Self-pay

## 2021-05-16 MED ORDER — ATORVASTATIN CALCIUM 40 MG PO TABS: 40.0000 mg | ORAL_TABLET | Freq: Every day | ORAL | 2 refills | Status: DC

## 2021-05-21 ENCOUNTER — Encounter: Payer: Self-pay | Admitting: Nurse Practitioner

## 2021-05-21 ENCOUNTER — Other Ambulatory Visit: Payer: Self-pay

## 2021-05-21 ENCOUNTER — Other Ambulatory Visit: Payer: Self-pay | Admitting: Nurse Practitioner

## 2021-05-21 ENCOUNTER — Telehealth (INDEPENDENT_AMBULATORY_CARE_PROVIDER_SITE_OTHER): Payer: Managed Care, Other (non HMO) | Admitting: Nurse Practitioner

## 2021-05-21 ENCOUNTER — Other Ambulatory Visit (INDEPENDENT_AMBULATORY_CARE_PROVIDER_SITE_OTHER): Payer: Managed Care, Other (non HMO)

## 2021-05-21 DIAGNOSIS — J029 Acute pharyngitis, unspecified: Secondary | ICD-10-CM | POA: Insufficient documentation

## 2021-05-21 DIAGNOSIS — R051 Acute cough: Secondary | ICD-10-CM | POA: Diagnosis not present

## 2021-05-21 DIAGNOSIS — R6883 Chills (without fever): Secondary | ICD-10-CM | POA: Diagnosis not present

## 2021-05-21 DIAGNOSIS — J020 Streptococcal pharyngitis: Secondary | ICD-10-CM | POA: Insufficient documentation

## 2021-05-21 LAB — POCT INFLUENZA A/B
Influenza A, POC: NEGATIVE
Influenza B, POC: NEGATIVE

## 2021-05-21 LAB — POCT RAPID STREP A (OFFICE): Rapid Strep A Screen: POSITIVE — AB

## 2021-05-21 MED ORDER — AZITHROMYCIN 250 MG PO TABS: ORAL_TABLET | ORAL | 0 refills | Status: AC

## 2021-05-21 MED ORDER — BENZONATATE 200 MG PO CAPS: 200.0000 mg | ORAL_CAPSULE | Freq: Three times a day (TID) | ORAL | 0 refills | Status: AC | PRN

## 2021-05-21 NOTE — Progress Notes (Signed)
Patient ID: Timothy Fuller, male    DOB: 12/01/1965, 55 y.o.   MRN: [id number]  Virtual visit completed through [place], a video enabled telemedicine application. Due to national recommendations of social distancing due to COVID-19, a virtual visit is felt to be most appropriate for this patient at this time. Reviewed limitations, risks, security and privacy concerns of performing a virtual visit and the availability of in person appointments. I also reviewed that there may be a patient responsible charge related to this service. The patient agreed to proceed.   Patient location: home Provider location: [place] at [facility], office Persons participating in this virtual visit: patient, provider   If any vitals were documented, they were collected by patient at home unless specified below.    There were no vitals taken for this visit.   CC: Cough Subjective:   HPI: Timothy Fuller is a 55 y.o. male presenting on 05/21/2021 for Cough (Sx started on 05/20/21, Stuffy nose, some dizziness, sore throat, chills, coughing up grey color phlegm.)  Syptoms on Sunday 05/20/2021 Has not tested for covid yet Pfizer vaccine x 2 Sick contacts:none  OTC treatment: dayquill has helped a little   Relevant past medical, surgical, family and social history reviewed and updated as indicated. Interim medical history since our last visit reviewed. Allergies and medications reviewed and updated. Outpatient Medications Prior to Visit  Medication Sig Dispense Refill   ALPRAZolam (XANAX) 1 MG tablet TAKE 1 TABLET(1 MG) BY MOUTH THREE TIMES DAILY AS NEEDED FOR ANXIETY 90 tablet 2   amLODipine (NORVASC) 5 MG tablet TAKE 1 TABLET(5 MG) BY MOUTH DAILY 90 tablet 3   aspirin-acetaminophen-caffeine (EXCEDRIN MIGRAINE) 250-250-65 MG tablet Take 1 tablet by mouth every 8 (eight) hours as needed for headache. 30 tablet 0   atorvastatin (LIPITOR) 40 MG tablet Take 1 tablet (40 mg total) by mouth daily. 90 tablet 2    Multiple Vitamin (MULTIVITAMIN) tablet Take 1 tablet by mouth daily.     sildenafil (REVATIO) 20 MG tablet TAKE 3 TO 5 TABLETS BY MOUTH AS NEEDED 60 tablet 5   fluticasone (FLONASE) 50 MCG/ACT nasal spray Place 1 spray into both nostrils 2 (two) times daily.     sildenafil (VIAGRA) 100 MG tablet TAKE 1/2 TO 1 TABLET(50 TO 100 MG) BY MOUTH DAILY AS NEEDED FOR ERECTILE DYSFUNCTION (Patient not taking: Reported on 02/08/2021) 10 tablet 0   No facility-administered medications prior to visit.     Per HPI unless specifically indicated in ROS section below Review of Systems  Constitutional:  Positive for chills and fatigue. Negative for fever.  HENT:  Positive for congestion, sinus pressure and sore throat (improved). Negative for ear discharge, ear pain and postnasal drip.   Respiratory:  Positive for cough (Thick, grey color) and shortness of breath.   Cardiovascular:  Negative for chest pain.  Gastrointestinal:  Negative for abdominal pain, diarrhea, nausea and vomiting.  Musculoskeletal:  Negative for arthralgias and myalgias.  Neurological:  Negative for headaches.  Objective:  There were no vitals taken for this visit.  Wt Readings from Last 3 Encounters:  02/08/21 225 lb (102.1 kg)  05/01/20 233 lb 6.4 oz (105.9 kg)  12/28/19 229 lb (103.9 kg)       Physical exam: Gen: alert, NAD, not ill appearing Pulm: speaks in complete sentences without increased work of breathing Psych: normal mood, normal thought content      Results for orders placed or performed in visit on 02/08/21  VITAMIN D 25  Hydroxy (Vit-D Deficiency, Fractures)  Result Value Ref Range   VITD 29.07 (L) 30.00 - 100.00 ng/mL  Basic metabolic panel  Result Value Ref Range   Sodium 138 135 - 145 mEq/L   Potassium 4.5 3.5 - 5.1 mEq/L   Chloride 103 96 - 112 mEq/L   CO2 30 19 - 32 mEq/L   Glucose, Bld 97 70 - 99 mg/dL   BUN 18 6 - 23 mg/dL   Creatinine, Ser 1.06 0.40 - 1.50 mg/dL   GFR 80.28 >60.00 mL/min    Calcium 9.7 8.4 - 10.5 mg/dL  PSA  Result Value Ref Range   PSA 0.43 0.10 - 4.00 ng/mL  Urinalysis, Routine w reflex microscopic  Result Value Ref Range   Color, Urine YELLOW Yellow;Lt. Yellow;Straw;Dark Yellow;Amber;Green;Red;Brown   APPearance CLEAR Clear;Turbid;Slightly Cloudy;Cloudy   Specific Gravity, Urine 1.010 1.000 - 1.030   pH 6.0 5.0 - 8.0   Total Protein, Urine NEGATIVE Negative   Urine Glucose NEGATIVE Negative   Ketones, ur NEGATIVE Negative   Bilirubin Urine NEGATIVE Negative   Hgb urine dipstick TRACE-INTACT (A) Negative   Urobilinogen, UA 0.2 0.0 - 1.0   Leukocytes,Ua NEGATIVE Negative   Nitrite NEGATIVE Negative   WBC, UA none seen 0-2/hpf   RBC / HPF 0-2/hpf 0-2/hpf  CBC with Differential/Platelet  Result Value Ref Range   WBC 5.5 4.0 - 10.5 K/uL   RBC 5.60 4.22 - 5.81 Mil/uL   Hemoglobin 15.1 13.0 - 17.0 g/dL   HCT 45.8 39.0 - 52.0 %   MCV 81.6 78.0 - 100.0 fl   MCHC 32.9 30.0 - 36.0 g/dL   RDW 14.7 11.5 - 15.5 %   Platelets 284.0 150.0 - 400.0 K/uL   Neutrophils Relative % 45.6 43.0 - 77.0 %   Lymphocytes Relative 42.5 12.0 - 46.0 %   Monocytes Relative 9.3 3.0 - 12.0 %   Eosinophils Relative 2.1 0.0 - 5.0 %   Basophils Relative 0.5 0.0 - 3.0 %   Neutro Abs 2.5 1.4 - 7.7 K/uL   Lymphs Abs 2.3 0.7 - 4.0 K/uL   Monocytes Absolute 0.5 0.1 - 1.0 K/uL   Eosinophils Absolute 0.1 0.0 - 0.7 K/uL   Basophils Absolute 0.0 0.0 - 0.1 K/uL  TSH  Result Value Ref Range   TSH 1.62 0.35 - 5.50 uIU/mL  Hepatic function panel  Result Value Ref Range   Total Bilirubin 0.5 0.2 - 1.2 mg/dL   Bilirubin, Direct 0.1 0.0 - 0.3 mg/dL   Alkaline Phosphatase 43 39 - 117 U/L   AST 25 0 - 37 U/L   ALT 30 0 - 53 U/L   Total Protein 7.1 6.0 - 8.3 g/dL   Albumin 4.2 3.5 - 5.2 g/dL  Lipid panel  Result Value Ref Range   Cholesterol 181 0 - 200 mg/dL   Triglycerides 121.0 0.0 - 149.0 mg/dL   HDL 41.20 >39.00 mg/dL   VLDL 24.2 0.0 - 40.0 mg/dL   LDL Cholesterol 115 (H) 0 -  99 mg/dL   Total CHOL/HDL Ratio 4    NonHDL 139.63   Hemoglobin A1c  Result Value Ref Range   Hgb A1c MFr Bld 6.0 4.6 - 6.5 %   Assessment & Plan:   Problem List Items Addressed This Visit       Other   Sore throat - Primary    Mouth sore throat when he swallows.  Continue using over-the-counter regimens for symptom relief pending flu, COVID, strep testing  Relevant Orders   Novel Coronavirus, NAA ([facility])   Influenza A/B   Rapid Strep A   Chills    Patient is experiencing chills unsure of fever.  Pending POC testing of COVID, flu, and strep testing.      Relevant Orders   Novel Coronavirus, NAA ([facility])   Influenza A/B   Acute cough    Patient been taking DayQuil without relief.  States he will get some relief but short-lived we will send in some Tessalon Perles for symptom control of cough.  Patient is pending COVID, flu, strep testing.      Relevant Medications   benzonatate (TESSALON) 200 MG capsule   Other Relevant Orders   Novel Coronavirus, NAA ([facility])   Influenza A/B     No orders of the defined types were placed in this encounter.  No orders of the defined types were placed in this encounter.   I discussed the assessment and treatment plan with the patient. The patient was provided an opportunity to ask questions and all were answered. The patient agreed with the plan and demonstrated an understanding of the instructions. The patient was advised to call back or seek an in-person evaluation if the symptoms worsen or if the condition fails to improve as anticipated.  Follow up plan: No follow-ups on file.  Romilda Garret, NP

## 2021-05-21 NOTE — Assessment & Plan Note (Signed)
Patient been taking DayQuil without relief.  States he will get some relief but short-lived we will send in some Tessalon Perles for symptom control of cough.  Patient is pending COVID, flu, strep testing.

## 2021-05-21 NOTE — Assessment & Plan Note (Signed)
Patient is experiencing chills unsure of fever.  Pending POC testing of COVID, flu, and strep testing.

## 2021-05-21 NOTE — Assessment & Plan Note (Signed)
Mouth sore throat when he swallows.  Continue using over-the-counter regimens for symptom relief pending flu, COVID, strep testing

## 2021-05-22 LAB — NOVEL CORONAVIRUS, NAA: SARS-CoV-2, NAA: NOT DETECTED

## 2021-05-22 LAB — SARS-COV-2, NAA 2 DAY TAT

## 2021-08-03 ENCOUNTER — Other Ambulatory Visit: Payer: Self-pay | Admitting: Internal Medicine

## 2021-08-03 NOTE — Telephone Encounter (Signed)
Please refill as per office routine med refill policy (all routine meds to be refilled for 3 mo or monthly (per pt preference) up to one year from last visit, then month to month grace period for 3 mo, then further med refills will have to be denied) ? ?

## 2021-08-09 ENCOUNTER — Other Ambulatory Visit: Payer: Self-pay

## 2021-08-09 ENCOUNTER — Telehealth (INDEPENDENT_AMBULATORY_CARE_PROVIDER_SITE_OTHER): Payer: Managed Care, Other (non HMO) | Admitting: Internal Medicine

## 2021-08-09 DIAGNOSIS — B349 Viral infection, unspecified: Secondary | ICD-10-CM | POA: Diagnosis not present

## 2021-08-09 DIAGNOSIS — R739 Hyperglycemia, unspecified: Secondary | ICD-10-CM | POA: Diagnosis not present

## 2021-08-09 DIAGNOSIS — E559 Vitamin D deficiency, unspecified: Secondary | ICD-10-CM | POA: Diagnosis not present

## 2021-08-09 MED ORDER — DIPHENOXYLATE-ATROPINE 2.5-0.025 MG PO TABS: 1.0000 | ORAL_TABLET | Freq: Four times a day (QID) | ORAL | 1 refills | Status: DC | PRN

## 2021-08-09 MED ORDER — ONDANSETRON HCL 4 MG PO TABS: 4.0000 mg | ORAL_TABLET | Freq: Three times a day (TID) | ORAL | 1 refills | Status: DC | PRN

## 2021-08-11 ENCOUNTER — Other Ambulatory Visit: Payer: Self-pay | Admitting: Internal Medicine

## 2021-08-12 ENCOUNTER — Encounter: Payer: Self-pay | Admitting: Internal Medicine

## 2021-08-12 DIAGNOSIS — B349 Viral infection, unspecified: Secondary | ICD-10-CM | POA: Insufficient documentation

## 2021-08-12 NOTE — Assessment & Plan Note (Signed)
Last vitamin D Lab Results  Component Value Date   VD25OH 29.07 (L) 02/08/2021   Low, to start oral replacement  

## 2021-08-12 NOTE — Assessment & Plan Note (Addendum)
Mild to mod, likely viral illness no covid, for zofran and lomotil prn symptoms, work note given, pt  to f/u any worsening symptoms or concerns

## 2021-08-12 NOTE — Assessment & Plan Note (Signed)
Lab Results  Component Value Date   HGBA1C 6.0 02/08/2021   Stable, pt to continue current medical treatment  - diet
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
## 2021-08-12 NOTE — Patient Instructions (Signed)
Please take all new medication as prescribed 

## 2021-09-08 ENCOUNTER — Other Ambulatory Visit: Payer: Self-pay | Admitting: Internal Medicine

## 2021-12-12 ENCOUNTER — Ambulatory Visit
Admission: EM | Admit: 2021-12-12 | Discharge: 2021-12-12 | Disposition: A | Discharge status: Home / Self Care | Payer: Managed Care, Other (non HMO) | Attending: Family Medicine | Admitting: Family Medicine

## 2021-12-12 DIAGNOSIS — Z113 Encounter for screening for infections with a predominantly sexual mode of transmission: Secondary | ICD-10-CM | POA: Insufficient documentation
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
## 2021-12-12 NOTE — Discharge Instructions (Addendum)
You do have MyChart for access to your lab results.  Staff will call you if anything is positive on your swab or on your blood test

## 2021-12-12 NOTE — ED Triage Notes (Signed)
Pt presents for STD testing with no known symptoms.  

## 2021-12-13 LAB — HIV ANTIBODY (ROUTINE TESTING W REFLEX): HIV Screen 4th Generation wRfx: NONREACTIVE

## 2021-12-13 LAB — CYTOLOGY, (ORAL, ANAL, URETHRAL) ANCILLARY ONLY
Chlamydia: NEGATIVE
Comment: NEGATIVE
Comment: NEGATIVE
Comment: NORMAL
Neisseria Gonorrhea: NEGATIVE
Trichomonas: NEGATIVE

## 2021-12-13 LAB — RPR: RPR Ser Ql: NONREACTIVE

## 2021-12-20 ENCOUNTER — Telehealth: Payer: Self-pay

## 2021-12-20 MED ORDER — ALPRAZOLAM 1 MG PO TABS: ORAL_TABLET | ORAL | 2 refills | Status: DC

## 2021-12-20 NOTE — Telephone Encounter (Signed)
Pt is calling requesting refill on: ALPRAZolam Duanne Moron) 1 MG tablet  Pharmacy: [facility] [place], [place] AT [place] 02/08/21 ROV 02/11/22

## 2021-12-20 NOTE — Telephone Encounter (Signed)
Done 6-22 2023

## 2022-02-11 ENCOUNTER — Ambulatory Visit: Payer: Managed Care, Other (non HMO) | Admitting: Internal Medicine

## 2022-02-11 ENCOUNTER — Encounter: Payer: Self-pay | Admitting: Internal Medicine

## 2022-02-11 VITALS — BP 148/94 | HR 85 | Temp 98.3°F | Ht 73.0 in | Wt 230.0 lb

## 2022-02-11 DIAGNOSIS — R9431 Abnormal electrocardiogram [ECG] [EKG]: Secondary | ICD-10-CM

## 2022-02-11 DIAGNOSIS — E7800 Pure hypercholesterolemia, unspecified: Secondary | ICD-10-CM

## 2022-02-11 DIAGNOSIS — E559 Vitamin D deficiency, unspecified: Secondary | ICD-10-CM | POA: Diagnosis not present

## 2022-02-11 DIAGNOSIS — Z125 Encounter for screening for malignant neoplasm of prostate: Secondary | ICD-10-CM

## 2022-02-11 DIAGNOSIS — Z0001 Encounter for general adult medical examination with abnormal findings: Secondary | ICD-10-CM | POA: Diagnosis not present

## 2022-02-11 DIAGNOSIS — R739 Hyperglycemia, unspecified: Secondary | ICD-10-CM

## 2022-02-11 DIAGNOSIS — E538 Deficiency of other specified B group vitamins: Secondary | ICD-10-CM | POA: Diagnosis not present

## 2022-02-11 DIAGNOSIS — I10 Essential (primary) hypertension: Secondary | ICD-10-CM | POA: Diagnosis not present

## 2022-02-11 LAB — BASIC METABOLIC PANEL
BUN: 20 mg/dL (ref 6–23)
CO2: 28 mEq/L (ref 19–32)
Calcium: 9.6 mg/dL (ref 8.4–10.5)
Chloride: 102 mEq/L (ref 96–112)
Creatinine, Ser: 1.06 mg/dL (ref 0.40–1.50)
GFR: 79.71 mL/min (ref >60.00)
Glucose, Bld: 109 mg/dL — ABNORMAL HIGH (ref 70–99)
Potassium: 4.2 mEq/L (ref 3.5–5.1)
Sodium: 137 mEq/L (ref 135–145)

## 2022-02-11 LAB — CBC WITH DIFFERENTIAL/PLATELET
Basophils Absolute: 0 10*3/uL (ref 0.0–0.1)
Basophils Relative: 0.5 % (ref 0.0–3.0)
Eosinophils Absolute: 0.2 10*3/uL (ref 0.0–0.7)
Eosinophils Relative: 2.8 % (ref 0.0–5.0)
HCT: 46.9 % (ref 39.0–52.0)
Hemoglobin: 15.4 g/dL (ref 13.0–17.0)
Lymphocytes Relative: 50.7 % — ABNORMAL HIGH (ref 12.0–46.0)
Lymphs Abs: 3.4 10*3/uL (ref 0.7–4.0)
MCHC: 32.9 g/dL (ref 30.0–36.0)
MCV: 81.9 fl (ref 78.0–100.0)
Monocytes Absolute: 0.6 10*3/uL (ref 0.1–1.0)
Monocytes Relative: 9 % (ref 3.0–12.0)
Neutro Abs: 2.5 10*3/uL (ref 1.4–7.7)
Neutrophils Relative %: 37 % — ABNORMAL LOW (ref 43.0–77.0)
Platelets: 270 10*3/uL (ref 150.0–400.0)
RBC: 5.72 Mil/uL (ref 4.22–5.81)
RDW: 14.3 % (ref 11.5–15.5)
WBC: 6.7 10*3/uL (ref 4.0–10.5)

## 2022-02-11 LAB — LIPID PANEL
Cholesterol: 151 mg/dL (ref 0–200)
HDL: 37.8 mg/dL — ABNORMAL LOW (ref >39.00)
LDL Cholesterol: 85 mg/dL (ref 0–99)
NonHDL: 113.48
Total CHOL/HDL Ratio: 4
Triglycerides: 142 mg/dL (ref 0.0–149.0)
VLDL: 28.4 mg/dL (ref 0.0–40.0)

## 2022-02-11 LAB — VITAMIN D 25 HYDROXY (VIT D DEFICIENCY, FRACTURES): VITD: 22.45 ng/mL — ABNORMAL LOW (ref 30.00–100.00)

## 2022-02-11 LAB — PSA: PSA: 0.27 ng/mL (ref 0.10–4.00)

## 2022-02-11 LAB — TSH: TSH: 2.23 u[IU]/mL (ref 0.35–5.50)

## 2022-02-11 LAB — HEPATIC FUNCTION PANEL
ALT: 26 U/L (ref 0–53)
AST: 20 U/L (ref 0–37)
Albumin: 4.2 g/dL (ref 3.5–5.2)
Alkaline Phosphatase: 52 U/L (ref 39–117)
Bilirubin, Direct: 0.1 mg/dL (ref 0.0–0.3)
Total Bilirubin: 0.4 mg/dL (ref 0.2–1.2)
Total Protein: 7.2 g/dL (ref 6.0–8.3)

## 2022-02-11 LAB — VITAMIN B12: Vitamin B-12: 401 pg/mL (ref 211–911)

## 2022-02-11 LAB — HEMOGLOBIN A1C: Hgb A1c MFr Bld: 6.2 % (ref 4.6–6.5)

## 2022-02-11 MED ORDER — ATORVASTATIN CALCIUM 40 MG PO TABS: 40.0000 mg | ORAL_TABLET | Freq: Every day | ORAL | 3 refills | Status: DC

## 2022-02-11 MED ORDER — AMLODIPINE BESYLATE 5 MG PO TABS: ORAL_TABLET | ORAL | 3 refills | Status: DC

## 2022-02-11 NOTE — Assessment & Plan Note (Signed)
Lab Results  Component Value Date   HGBA1C 6.0 02/08/2021   Stable, pt to continue current medical treatment  - diet,wt control, excercise

## 2022-02-11 NOTE — Patient Instructions (Addendum)
Please have your Shingrix (shingles) shots done at your local pharmacy.  Please continue all other medications as before, and refills have been done if requested.  Please have the pharmacy call with any other refills you may need.  Please continue your efforts at being more active, low cholesterol diet, and weight control.  You are otherwise up to date with prevention measures today.  Please keep your appointments with your specialists as you may have planned  You will be contacted regarding the referral for: cardiac CT score  Please go to the LAB at the blood drawing area for the tests to be done  You will be contacted by phone if any changes need to be made immediately.  Otherwise, you will receive a letter about your results with an explanation, but please check with MyChart first.  Please remember to sign up for MyChart if you have not done so, as this will be important to you in the future with finding out test results, communicating by private email, and scheduling acute appointments online when needed.  Please make an Appointment to return for your 1 year visit, or sooner if needed

## 2022-02-11 NOTE — Assessment & Plan Note (Signed)
Last vitamin D Lab Results  Component Value Date   VD25OH 29.07 (L) 02/08/2021   Low, to start oral replacement
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
## 2022-02-11 NOTE — Assessment & Plan Note (Signed)
Lab Results  Component Value Date   LDLCALC 115 (H) 02/08/2021   Uncontrolled, goal ld l < 100, to check card CT score to see if goal needs to be < 70, pt to continue current statin lpitor4 mg qd for now, check lipids with labs today

## 2022-02-11 NOTE — Assessment & Plan Note (Signed)
BP Readings from Last 3 Encounters:  02/11/22 (!) 148/94  12/12/21 137/80  02/08/21 138/86   Uncontrolled, pt to continue medical treatment amlod 5 mg qd as declines change today

## 2022-02-11 NOTE — Assessment & Plan Note (Signed)
Age and sex appropriate education and counseling updated with regular exercise and diet Referrals for preventative services - none needed Immunizations addressed - for shingrix at local pharmacy Smoking counseling  - none needed Evidence for depression or other mood disorder - none significant Most recent labs reviewed. I have personally reviewed and have noted: 1) the patient's medical and social history 2) The patient's current medications and supplements 3) The patient's height, weight, and BMI have been recorded in the chart

## 2022-02-28 ENCOUNTER — Other Ambulatory Visit: Payer: Self-pay | Admitting: Family Medicine

## 2022-02-28 ENCOUNTER — Ambulatory Visit: Payer: Self-pay

## 2022-02-28 DIAGNOSIS — M545 Low back pain, unspecified: Secondary | ICD-10-CM

## 2022-03-11 ENCOUNTER — Ambulatory Visit (INDEPENDENT_AMBULATORY_CARE_PROVIDER_SITE_OTHER): Payer: Managed Care, Other (non HMO) | Admitting: Emergency Medicine

## 2022-03-11 ENCOUNTER — Encounter: Payer: Self-pay | Admitting: Emergency Medicine

## 2022-03-11 VITALS — BP 120/70 | HR 86 | Temp 98.5°F | Ht 73.0 in | Wt 229.4 lb

## 2022-03-11 DIAGNOSIS — A059 Bacterial foodborne intoxication, unspecified: Secondary | ICD-10-CM | POA: Insufficient documentation

## 2022-03-11 DIAGNOSIS — Z23 Encounter for immunization: Secondary | ICD-10-CM | POA: Diagnosis not present
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
## 2022-03-11 NOTE — Assessment & Plan Note (Signed)
Clinically stable.  No red flag signs or symptoms Stable vital signs.  Benign abdominal examination. Advised to stay on clear liquids for 24 hours BRAT diet after that and advance slowly. ED precautions given. Tylenol for pain as needed Advised to contact the office if no better or worse during the next several days

## 2022-03-11 NOTE — Patient Instructions (Signed)
Food Poisoning Food poisoning is caused by eating or drinking foods or drinks that are spoiled. In most cases, food poisoning is mild and lasts 2-7 days. However, some cases can be serious, especially for people who have a weak body defense system (immune system), like older people, children and babies, and pregnant women. What are the causes? This condition is caused by eating foods that contain viruses, bacteria, parasites, or mold. This can happen by: Not washing your hands well enough or often enough. Not storing food properly. Preparing, serving, and storing food on surfaces that are not clean. Cooking or eating with utensils that are not clean. Not cooking food to the right temperature. Viruses, bacteria, or parasites can hurt the intestine. This often causes very bad watery poop (diarrhea). The most common causes of this condition are: Viruses, such as: Norovirus. Rotavirus. Bacteria, such as: Salmonella. Listeria. E. coli (Escherichia coli). Parasites, such as: Giardia. Toxoplasma gondii. What are the signs or symptoms? Feeling like you may vomit (nauseous). Vomiting. Cramping or belly pain. Watery poop. Fever. Chills. Muscle aches. Not having enough water in the body (dehydration). How is this treated? Making sure that you get enough to drink. Taking medicines. In very bad cases, you may need: To be treated in a hospital. To get fluids through an IV tube. Follow these instructions at home: Eating and drinking  Drink enough fluids to keep your pee (urine) pale yellow. You may need to drink small amounts of clear liquids often. Avoid: Milk. Caffeine. Alcohol. Ask your doctor how you should get enough fluid in your body. Eat small meals often instead of eating large meals. Medicines Take over-the-counter and prescription medicines only as told by your doctor. Ask your doctor if you should keep taking any of your regular medicines. If you were prescribed an  antibiotic medicine, take it as told by your doctor. Do not stop taking the antibiotic even if you start to feel better. General instructions  Wash your hands with soap and water for at least 20 seconds: Before you prepare food. After you go to the bathroom (use the toilet). Make sure that the people who live with you also wash their hands often. Rest at home until you feel better. Clean surfaces that you touch with a product that contains chlorine bleach. Keep all follow-up visits. How is this prevented? Before and after you handle raw foods: Wash your hands. Wash surfaces used to prepare the food. Wash utensils. Do not prepare or store raw meat in the same place you prepare or store fresh fruits and vegetables. Keep refrigerated foods colder than 39F (5C). Serve hot foods right away or keep them heated above 139F (60C). Store dry foods in cool, dry spaces. Keep them away from too much heat or moisture. Throw out any foods that: Do not smell right. Are in cans that are bulging. Heat canned foods fully before you taste them. Drink bottled or germ-free (sterile) water when you travel. Get help right away if: You have trouble: Breathing. Swallowing. Talking. Moving. You have blurry vision. You faint. The whites of your eyes turn yellow (jaundice). You cannot eat or drink without vomiting. You continue to vomit or have watery poop. You start to have pain in your belly, your belly pain gets worse, or your belly pain stays in one small area. You have a fever. You have blood or mucus in your poop (stools), or your poop looks dark black and tarry. You have signs of not having enough water in  your body, such as: Dark pee, very little pee, or no pee. Not making tears while crying. Dry mouth or lips. Sunken eyes. Being sleepy. Feeling weak. Being dizzy. These symptoms may be an emergency. Get help right away. Call 911. Do not wait to see if the symptoms will go away. Do not  drive yourself to the hospital. Summary Food poisoning is an illness that is caused by eating or drinking spoiled foods or drinks. Symptoms may include feeling like you may vomit, vomiting, and having watery poop. In most cases, the illness will get better on its own in 2-7 days. In very bad cases, you may need to stay at the hospital. This information is not intended to replace advice given to you by your health care provider. Make sure you discuss any questions you have with your health care provider. Document Revised: 05/07/2021 Document Reviewed: 05/07/2021 Elsevier Patient Education  [place].

## 2022-03-26 ENCOUNTER — Encounter: Payer: Self-pay | Admitting: Orthopaedic Surgery

## 2022-03-26 ENCOUNTER — Ambulatory Visit (INDEPENDENT_AMBULATORY_CARE_PROVIDER_SITE_OTHER): Payer: Worker's Compensation | Admitting: Orthopaedic Surgery

## 2022-03-26 DIAGNOSIS — M545 Low back pain, unspecified: Secondary | ICD-10-CM | POA: Diagnosis not present
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
## 2022-03-26 NOTE — Addendum Note (Signed)
Addended by: Robyne Peers on: 03/26/2022 11:26 AM   Modules accepted: Orders

## 2022-03-27 ENCOUNTER — Telehealth: Payer: Self-pay | Admitting: Internal Medicine

## 2022-03-27 MED ORDER — ALPRAZOLAM 1 MG PO TABS: ORAL_TABLET | ORAL | 2 refills | Status: DC

## 2022-03-27 NOTE — Telephone Encounter (Signed)
Pt is requesting a refill of his ALPRAZolam (XANAX) 1 MG tablet RX.  Last OV 02-11-22  Please send RX to [facility] #[id number]  Phone:  [phone]  Fax:  [phone]

## 2022-04-16 ENCOUNTER — Other Ambulatory Visit: Payer: Managed Care, Other (non HMO)

## 2022-04-19 ENCOUNTER — Telehealth: Payer: Self-pay | Admitting: Orthopaedic Surgery

## 2022-04-19 NOTE — Telephone Encounter (Signed)
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

## 2022-04-21 ENCOUNTER — Emergency Department (HOSPITAL_COMMUNITY): Payer: Managed Care, Other (non HMO)

## 2022-04-21 ENCOUNTER — Emergency Department (HOSPITAL_COMMUNITY)
Admission: EM | Admit: 2022-04-21 | Discharge: 2022-04-21 | Disposition: A | Discharge status: Home / Self Care | Payer: Managed Care, Other (non HMO) | Attending: Emergency Medicine | Admitting: Emergency Medicine

## 2022-04-21 DIAGNOSIS — S39012A Strain of muscle, fascia and tendon of lower back, initial encounter: Secondary | ICD-10-CM | POA: Diagnosis not present

## 2022-04-21 DIAGNOSIS — Y92410 Unspecified street and highway as the place of occurrence of the external cause: Secondary | ICD-10-CM | POA: Insufficient documentation

## 2022-04-21 DIAGNOSIS — R519 Headache, unspecified: Secondary | ICD-10-CM | POA: Insufficient documentation

## 2022-04-21 DIAGNOSIS — S161XXA Strain of muscle, fascia and tendon at neck level, initial encounter: Secondary | ICD-10-CM | POA: Insufficient documentation

## 2022-04-21 DIAGNOSIS — Z79899 Other long term (current) drug therapy: Secondary | ICD-10-CM | POA: Diagnosis not present

## 2022-04-21 DIAGNOSIS — M542 Cervicalgia: Secondary | ICD-10-CM | POA: Diagnosis present

## 2022-04-21 MED ORDER — METHOCARBAMOL 500 MG PO TABS: 500.0000 mg | ORAL_TABLET | Freq: Two times a day (BID) | ORAL | 0 refills | Status: DC

## 2022-04-21 MED ORDER — OXYCODONE-ACETAMINOPHEN 5-325 MG PO TABS
1.0000 | ORAL_TABLET | Freq: Once | ORAL | Status: AC
  Administered 2022-04-21: 1 via ORAL
  Filled 2022-04-21: qty 1

## 2022-04-21 MED ORDER — IBUPROFEN 600 MG PO TABS: 600.0000 mg | ORAL_TABLET | Freq: Four times a day (QID) | ORAL | 0 refills | Status: DC | PRN

## 2022-04-21 MED ORDER — ACETAMINOPHEN 500 MG PO TABS: 500.0000 mg | ORAL_TABLET | Freq: Four times a day (QID) | ORAL | 0 refills | Status: DC | PRN
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
## 2022-04-21 NOTE — ED Notes (Signed)
Patient transported to imaging.
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
## 2022-04-25 ENCOUNTER — Encounter: Payer: Self-pay | Admitting: Emergency Medicine

## 2022-04-25 ENCOUNTER — Ambulatory Visit: Payer: Managed Care, Other (non HMO) | Admitting: Emergency Medicine

## 2022-04-25 VITALS — BP 136/82 | HR 73 | Temp 98.3°F | Ht 73.0 in | Wt 231.1 lb

## 2022-04-25 DIAGNOSIS — M62830 Muscle spasm of back: Secondary | ICD-10-CM

## 2022-04-25 DIAGNOSIS — M545 Low back pain, unspecified: Secondary | ICD-10-CM

## 2022-04-25 MED ORDER — HYDROCODONE-ACETAMINOPHEN 5-325 MG PO TABS: 1.0000 | ORAL_TABLET | Freq: Four times a day (QID) | ORAL | 0 refills | Status: DC | PRN

## 2022-04-25 NOTE — Assessment & Plan Note (Signed)
Continue muscle relaxants Heat pad several times a day Rest.  Avoid heavy exercise May need physical therapy and orthopedic evaluation if no better after 1 to 2 weeks.
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
## 2022-04-25 NOTE — Assessment & Plan Note (Signed)
Worse than a couple days ago.  Tylenol and Advil not enough for moderate to severe pain. Recommend Norco every 6-8 hours for moderate to severe pain.

## 2022-04-25 NOTE — Patient Instructions (Signed)
Acute Back Pain, Adult Acute back pain is sudden and usually short-lived. It is often caused by an injury to the muscles and tissues in the back. The injury may result from: A muscle, tendon, or ligament getting overstretched or torn. Ligaments are tissues that connect bones to each other. Lifting something improperly can cause a back strain. Wear and tear (degeneration) of the spinal disks. Spinal disks are circular tissue that provide cushioning between the bones of the spine (vertebrae). Twisting motions, such as while playing sports or doing yard work. A hit to the back. Arthritis. You may have a physical exam, lab tests, and imaging tests to find the cause of your pain. Acute back pain usually goes away with rest and home care. Follow these instructions at home: Managing pain, stiffness, and swelling Take over-the-counter and prescription medicines only as told by your health care provider. Treatment may include medicines for pain and inflammation that are taken by mouth or applied to the skin, or muscle relaxants. Your health care provider may recommend applying ice during the first 24-48 hours after your pain starts. To do this: Put ice in a plastic bag. Place a towel between your skin and the bag. Leave the ice on for 20 minutes, 2-3 times a day. Remove the ice if your skin turns bright red. This is very important. If you cannot feel pain, heat, or cold, you have a greater risk of damage to the area. If directed, apply heat to the affected area as often as told by your health care provider. Use the heat source that your health care provider recommends, such as a moist heat pack or a heating pad. Place a towel between your skin and the heat source. Leave the heat on for 20-30 minutes. Remove the heat if your skin turns bright red. This is especially important if you are unable to feel pain, heat, or cold. You have a greater risk of getting burned. Activity  Do not stay in bed. Staying in  bed for more than 1-2 days can delay your recovery. Sit up and stand up straight. Avoid leaning forward when you sit or hunching over when you stand. If you work at a desk, sit close to it so you do not need to lean over. Keep your chin tucked in. Keep your neck drawn back, and keep your elbows bent at a 90-degree angle (right angle). Sit high and close to the steering wheel when you drive. Add lower back (lumbar) support to your car seat, if needed. Take short walks on even surfaces as soon as you are able. Try to increase the length of time you walk each day. Do not sit, drive, or stand in one place for more than 30 minutes at a time. Sitting or standing for long periods of time can put stress on your back. Do not drive or use heavy machinery while taking prescription pain medicine. Use proper lifting techniques. When you bend and lift, use positions that put less stress on your back: Bend your knees. Keep the load close to your body. Avoid twisting. Exercise regularly as told by your health care provider. Exercising helps your back heal faster and helps prevent back injuries by keeping muscles strong and flexible. Work with a physical therapist to make a safe exercise program, as recommended by your health care provider. Do any exercises as told by your physical therapist. Lifestyle Maintain a healthy weight. Extra weight puts stress on your back and makes it difficult to have good   posture. Avoid activities or situations that make you feel anxious or stressed. Stress and anxiety increase muscle tension and can make back pain worse. Learn ways to manage anxiety and stress, such as through exercise. General instructions Sleep on a firm mattress in a comfortable position. Try lying on your side with your knees slightly bent. If you lie on your back, put a pillow under your knees. Keep your head and neck in a straight line with your spine (neutral position) when using electronic equipment like  smartphones or pads. To do this: Raise your smartphone or pad to look at it instead of bending your head or neck to look down. Put the smartphone or pad at the level of your face while looking at the screen. Follow your treatment plan as told by your health care provider. This may include: Cognitive or behavioral therapy. Acupuncture or massage therapy. Meditation or yoga. Contact a health care provider if: You have pain that is not relieved with rest or medicine. You have increasing pain going down into your legs or buttocks. Your pain does not improve after 2 weeks. You have pain at night. You lose weight without trying. You have a fever or chills. You develop nausea or vomiting. You develop abdominal pain. Get help right away if: You develop new bowel or bladder control problems. You have unusual weakness or numbness in your arms or legs. You feel faint. These symptoms may represent a serious problem that is an emergency. Do not wait to see if the symptoms will go away. Get medical help right away. Call your local emergency services (911 in the [place] Do not drive yourself to the hospital. Summary Acute back pain is sudden and usually short-lived. Use proper lifting techniques. When you bend and lift, use positions that put less stress on your back. Take over-the-counter and prescription medicines only as told by your health care provider, and apply heat or ice as told. This information is not intended to replace advice given to you by your health care provider. Make sure you discuss any questions you have with your health care provider. Document Revised: 09/08/2020 Document Reviewed: 09/08/2020 Elsevier Patient Education  2023 [employer]  

## 2022-05-01 ENCOUNTER — Telehealth: Payer: Self-pay | Admitting: Internal Medicine

## 2022-05-01 DIAGNOSIS — M545 Low back pain, unspecified: Secondary | ICD-10-CM

## 2022-05-01 MED ORDER — HYDROCODONE-ACETAMINOPHEN 5-325 MG PO TABS: 1.0000 | ORAL_TABLET | Freq: Four times a day (QID) | ORAL | 0 refills | Status: DC | PRN

## 2022-05-01 NOTE — Telephone Encounter (Signed)
Ok but hopefully will not need further after this refill, thanks

## 2022-05-01 NOTE — Telephone Encounter (Signed)
Pt called to request pain medication prescribed at last visit be refilled. Timothy Fuller.

## 2022-05-06 ENCOUNTER — Ambulatory Visit (INDEPENDENT_AMBULATORY_CARE_PROVIDER_SITE_OTHER): Payer: Worker's Compensation | Admitting: Orthopaedic Surgery

## 2022-05-06 ENCOUNTER — Encounter: Payer: Self-pay | Admitting: Orthopaedic Surgery

## 2022-05-06 DIAGNOSIS — M545 Low back pain, unspecified: Secondary | ICD-10-CM | POA: Diagnosis not present

## 2022-05-06 DIAGNOSIS — M542 Cervicalgia: Secondary | ICD-10-CM | POA: Diagnosis not present
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
## 2022-05-07 ENCOUNTER — Ambulatory Visit (INDEPENDENT_AMBULATORY_CARE_PROVIDER_SITE_OTHER): Payer: Managed Care, Other (non HMO) | Admitting: Internal Medicine

## 2022-05-07 VITALS — BP 134/80 | HR 84 | Temp 97.6°F | Ht 73.0 in | Wt 231.0 lb

## 2022-05-07 DIAGNOSIS — M79662 Pain in left lower leg: Secondary | ICD-10-CM | POA: Diagnosis not present

## 2022-05-07 DIAGNOSIS — S060X0D Concussion without loss of consciousness, subsequent encounter: Secondary | ICD-10-CM | POA: Diagnosis not present

## 2022-05-07 DIAGNOSIS — M12562 Traumatic arthropathy, left knee: Secondary | ICD-10-CM | POA: Insufficient documentation

## 2022-05-07 DIAGNOSIS — M25512 Pain in left shoulder: Secondary | ICD-10-CM | POA: Diagnosis not present

## 2022-05-07 DIAGNOSIS — S060XAA Concussion with loss of consciousness status unknown, initial encounter: Secondary | ICD-10-CM | POA: Insufficient documentation

## 2022-05-07 DIAGNOSIS — M7989 Other specified soft tissue disorders: Secondary | ICD-10-CM

## 2022-05-07 MED ORDER — CYCLOBENZAPRINE HCL 5 MG PO TABS: 5.0000 mg | ORAL_TABLET | Freq: Three times a day (TID) | ORAL | 1 refills | Status: DC | PRN

## 2022-05-07 MED ORDER — HYDROCODONE-ACETAMINOPHEN 10-325 MG PO TABS: 1.0000 | ORAL_TABLET | Freq: Three times a day (TID) | ORAL | 0 refills | Status: AC | PRN
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
## 2022-05-07 NOTE — Patient Instructions (Addendum)
Please take all new medication as prescribed - pain medication, and muscle relaxer as needed  Please continue all other medications as before, and refills have been done if requested.  Please have the pharmacy call with any other refills you may need.  Please keep your appointments with your specialists as you may have planned  You will be contacted regarding the referral for: sports medicine on the first floor for the knee and shoulder  You will be contacted regarding the referral for: Venous doppler for the left leg to check for blood clot  Please continue follow up with chiropracter as you have planned

## 2022-05-09 DIAGNOSIS — M25512 Pain in left shoulder: Secondary | ICD-10-CM | POA: Insufficient documentation

## 2022-05-09 NOTE — Assessment & Plan Note (Signed)
Mod to severe, for pain control, and f/u with Sports medicine

## 2022-05-09 NOTE — Assessment & Plan Note (Signed)
Moderate, for pain control, declines xray, for sports medicine follow up as well, can't r/o rotater cuff injury or traumatic bursitis

## 2022-05-09 NOTE — Assessment & Plan Note (Signed)
Mild, cognitive is intact today, CT head negative, d/w pt concussion diagnosis and natural history

## 2022-05-09 NOTE — Assessment & Plan Note (Signed)
Most likely this is related to the traumatic arthritis, but can't r/o dvt - for stat venous dopplers

## 2022-05-09 NOTE — Progress Notes (Deleted)
    Timothy Fuller Phone: [phone]   Assessment and Plan:     There are no diagnoses linked to this encounter.  ***   Pertinent previous records reviewed include ***   Follow Up: ***     Subjective:   I, Timothy Fuller, am serving as a Education administrator for Doctor Glennon Mac  Chief Complaint: left knee and shoulder pain   HPI:   05/10/2022 Patient is a 56 year old male complaining of left knee and shoulder pain. Patient states  Relevant Historical Information: ***  Additional pertinent review of systems negative.   Current Outpatient Medications:    acetaminophen (TYLENOL) 500 MG tablet, Take 1 tablet (500 mg total) by mouth every 6 (six) hours as needed., Disp: 30 tablet, Rfl: 0   ALPRAZolam (XANAX) 1 MG tablet, 1 tab by mouth three times per day as needed, Disp: 90 tablet, Rfl: 2   amLODipine (NORVASC) 5 MG tablet, 1 tab by mouth once daily, Disp: 90 tablet, Rfl: 3   atorvastatin (LIPITOR) 40 MG tablet, Take 1 tablet (40 mg total) by mouth daily., Disp: 90 tablet, Rfl: 3   cyclobenzaprine (FLEXERIL) 5 MG tablet, Take 1 tablet (5 mg total) by mouth 3 (three) times daily as needed for muscle spasms., Disp: 40 tablet, Rfl: 1   diphenoxylate-atropine (LOMOTIL) 2.5-0.025 MG tablet, Take 1 tablet by mouth 4 (four) times daily as needed for diarrhea or loose stools., Disp: 40 tablet, Rfl: 1   fluticasone (FLONASE) 50 MCG/ACT nasal spray, Place 1 spray into both nostrils 2 (two) times daily., Disp: , Rfl:    HYDROcodone-acetaminophen (NORCO) 10-325 MG tablet, Take 1 tablet by mouth every 8 (eight) hours as needed for up to 5 days., Disp: 30 tablet, Rfl: 0   ibuprofen (ADVIL) 600 MG tablet, Take 1 tablet (600 mg total) by mouth every 6 (six) hours as needed., Disp: 20 tablet, Rfl: 0   methocarbamol (ROBAXIN) 500 MG tablet, Take 1 tablet (500 mg total) by mouth 2 (two) times daily., Disp: 10 tablet,  Rfl: 0   Multiple Vitamin (MULTIVITAMIN) tablet, Take 1 tablet by mouth daily., Disp: , Rfl:    ondansetron (ZOFRAN) 4 MG tablet, Take 1 tablet (4 mg total) by mouth every 8 (eight) hours as needed for nausea or vomiting., Disp: 40 tablet, Rfl: 1   sildenafil (REVATIO) 20 MG tablet, TAKE 3 TO 5 TABLETS BY MOUTH AS NEEDED, Disp: 60 tablet, Rfl: 5   Objective:     There were no vitals filed for this visit.    There is no height or weight on file to calculate BMI.    Physical Exam:    ***   Electronically signed by:  Timothy Mccreedy D.[facility] 8:04 AM 05/09/22

## 2022-05-10 ENCOUNTER — Ambulatory Visit: Payer: Managed Care, Other (non HMO) | Admitting: Sports Medicine

## 2022-05-13 ENCOUNTER — Ambulatory Visit (HOSPITAL_COMMUNITY)
Admission: RE | Admit: 2022-05-13 | Discharge: 2022-05-13 | Disposition: A | Discharge status: Home / Self Care | Payer: Managed Care, Other (non HMO) | Source: Ambulatory Visit | Attending: Internal Medicine | Admitting: Internal Medicine

## 2022-05-13 DIAGNOSIS — M7989 Other specified soft tissue disorders: Secondary | ICD-10-CM

## 2022-05-16 ENCOUNTER — Telehealth: Payer: Self-pay | Admitting: Internal Medicine

## 2022-05-16 MED ORDER — HYDROCODONE-ACETAMINOPHEN 5-325 MG PO TABS: 1.0000 | ORAL_TABLET | Freq: Four times a day (QID) | ORAL | 0 refills | Status: DC | PRN

## 2022-05-16 NOTE — Telephone Encounter (Signed)
Caller & Relationship to patient:[name]   Call back number: [phone]   Date of last office visit:05/07/22   Date of next office visit:05/20/22   Medication(s) to be refilled: [place]        Preferred Pharmacy: [facility] on [address]

## 2022-05-16 NOTE — Telephone Encounter (Signed)
Ok for vicodin prn as rx, but would need ROV for further  Pt should see Sports Medicine on first floor as well, as discussed at last visit

## 2022-05-16 NOTE — Telephone Encounter (Signed)
Appointment scheduled for 11/20

## 2022-05-20 ENCOUNTER — Ambulatory Visit: Payer: Managed Care, Other (non HMO) | Admitting: Internal Medicine

## 2022-05-22 ENCOUNTER — Ambulatory Visit (INDEPENDENT_AMBULATORY_CARE_PROVIDER_SITE_OTHER): Payer: Managed Care, Other (non HMO) | Admitting: Internal Medicine

## 2022-05-22 ENCOUNTER — Encounter: Payer: Self-pay | Admitting: Internal Medicine

## 2022-05-22 VITALS — BP 136/78 | HR 88 | Temp 98.1°F | Ht 73.0 in | Wt 236.0 lb

## 2022-05-22 DIAGNOSIS — M7989 Other specified soft tissue disorders: Secondary | ICD-10-CM

## 2022-05-22 DIAGNOSIS — I10 Essential (primary) hypertension: Secondary | ICD-10-CM

## 2022-05-22 DIAGNOSIS — M79662 Pain in left lower leg: Secondary | ICD-10-CM | POA: Diagnosis not present

## 2022-05-22 DIAGNOSIS — E559 Vitamin D deficiency, unspecified: Secondary | ICD-10-CM | POA: Diagnosis not present

## 2022-05-22 DIAGNOSIS — M545 Low back pain, unspecified: Secondary | ICD-10-CM | POA: Diagnosis not present

## 2022-05-22 MED ORDER — ACETAMINOPHEN-CODEINE 300-30 MG PO TABS: 1.0000 | ORAL_TABLET | Freq: Four times a day (QID) | ORAL | 0 refills | Status: DC | PRN

## 2022-05-22 MED ORDER — CYCLOBENZAPRINE HCL 5 MG PO TABS: 5.0000 mg | ORAL_TABLET | Freq: Three times a day (TID) | ORAL | 1 refills | Status: DC | PRN

## 2022-05-22 NOTE — Patient Instructions (Signed)
Your leg appears to be improving  Please take all new medication as prescribed  - the tylenol with codeine as needed  Please continue all other medications as before, and refills have been done for flexeril as needed muscle relaxer  Please have the pharmacy call with any other refills you may need.  Please keep your appointments with your specialists as you may have planned

## 2022-05-22 NOTE — Assessment & Plan Note (Signed)
Mild to mod, for flexeril 5 tid prn,,  to f/u any worsening symptoms or concerns

## 2022-05-22 NOTE — Assessment & Plan Note (Signed)
Last vitamin D Lab Results  Component Value Date   VD25OH 22.45 (L) 02/11/2022   Low, reminded to start oral replacement
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
## 2022-05-22 NOTE — Assessment & Plan Note (Signed)
BP Readings from Last 3 Encounters:  05/22/22 136/78  05/07/22 134/80  04/25/22 136/82   Stable, pt to continue medical treatment norvasc 5 mg qd

## 2022-05-22 NOTE — Assessment & Plan Note (Signed)
Essentially resolved below the knee, but still with mild post traumatic diffuse upper leg, overall improved, for tylenol #3 prn pain

## 2022-06-03 ENCOUNTER — Telehealth: Payer: Self-pay | Admitting: Internal Medicine

## 2022-06-03 MED ORDER — ACETAMINOPHEN-CODEINE 300-30 MG PO TABS: 1.0000 | ORAL_TABLET | Freq: Four times a day (QID) | ORAL | 0 refills | Status: DC | PRN

## 2022-06-03 NOTE — Telephone Encounter (Signed)
Patient would like a refill on his tylenol #3 - last seen on 05/22/2022 - please send to [facility] on [address]

## 2022-06-03 NOTE — Telephone Encounter (Signed)
Ok done erx 

## 2022-06-05 ENCOUNTER — Ambulatory Visit: Payer: Managed Care, Other (non HMO) | Admitting: Family Medicine

## 2022-06-05 VITALS — BP 148/86 | HR 86 | Ht 73.0 in | Wt 234.0 lb

## 2022-06-05 DIAGNOSIS — S060X0A Concussion without loss of consciousness, initial encounter: Secondary | ICD-10-CM | POA: Diagnosis not present

## 2022-06-05 MED ORDER — NORTRIPTYLINE HCL 25 MG PO CAPS: 25.0000 mg | ORAL_CAPSULE | Freq: Every day | ORAL | 2 refills | Status: DC

## 2022-06-05 NOTE — Patient Instructions (Signed)
Thank you for coming in today.   Try nortriptyline at bedtime.   Recheck in 3 weeks.   Let me know if the medicine is not helpful. I can change it before you come back.   I can add vestibular PT for dizzy or cognitive rehab for thinking before you come back.   Ok to advance your activity as tolerated.
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
## 2022-06-19 ENCOUNTER — Telehealth: Payer: Self-pay | Admitting: Physical Medicine and Rehabilitation

## 2022-06-19 NOTE — Telephone Encounter (Signed)
Pt called state Dr Noberto Retort hand delivered a referral for this pt. Please call pt about this matter at [phone].
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
## 2022-06-26 ENCOUNTER — Telehealth: Payer: Self-pay | Admitting: Internal Medicine

## 2022-06-26 ENCOUNTER — Ambulatory Visit (INDEPENDENT_AMBULATORY_CARE_PROVIDER_SITE_OTHER): Payer: Managed Care, Other (non HMO) | Admitting: Family Medicine

## 2022-06-26 VITALS — BP 164/88 | HR 95 | Ht 73.0 in | Wt 239.0 lb

## 2022-06-26 DIAGNOSIS — S060X0D Concussion without loss of consciousness, subsequent encounter: Secondary | ICD-10-CM

## 2022-06-26 MED ORDER — TOPIRAMATE 25 MG PO TABS: 25.0000 mg | ORAL_TABLET | Freq: Two times a day (BID) | ORAL | 1 refills | Status: DC

## 2022-06-26 MED ORDER — ACETAMINOPHEN-CODEINE 300-30 MG PO TABS: 1.0000 | ORAL_TABLET | Freq: Four times a day (QID) | ORAL | 0 refills | Status: DC | PRN

## 2022-06-26 MED ORDER — ALPRAZOLAM 1 MG PO TABS: ORAL_TABLET | ORAL | 2 refills | Status: DC

## 2022-06-26 NOTE — Telephone Encounter (Signed)
Patient stopped by office and stated he needed some medications refilled. He needs the acetaminophen-codeine (tylenol #3) 300-'30mg'$  and the alprazolam (xanax) '1mg'$  refilled.

## 2022-06-26 NOTE — Patient Instructions (Signed)
Thank you for coming in today.   Try topamax for headache.  Take it 2x daily.   Return in 1 month.   Let me know sooner if this is not working.   Return to work on Jan 8th.

## 2022-06-26 NOTE — Telephone Encounter (Signed)
Ok these are refilled but pt should have ROV for further refills tylenol #3

## 2022-07-10 ENCOUNTER — Telehealth: Payer: Self-pay | Admitting: Physical Medicine and Rehabilitation

## 2022-07-10 NOTE — Telephone Encounter (Signed)
We received paper referral from Dr. Noberto Retort at [facility]. We will proceed with office visit for evaluation.

## 2022-07-10 NOTE — Telephone Encounter (Signed)
Patient states he is waiting on a call for an appointment. Please advise

## 2022-07-10 NOTE — Telephone Encounter (Signed)
Spoke to patient and he stated the referral was sent to our clinic. Called Dr. Hayes Ludwig office and they stated the referral was emailed over. Printed referral and given to Dr. Ernestina Patches

## 2022-07-15 ENCOUNTER — Ambulatory Visit: Payer: Managed Care, Other (non HMO) | Admitting: Internal Medicine

## 2022-07-15 ENCOUNTER — Telehealth: Payer: Self-pay | Admitting: Physical Medicine and Rehabilitation

## 2022-07-15 ENCOUNTER — Other Ambulatory Visit: Payer: Self-pay | Admitting: Internal Medicine

## 2022-07-15 VITALS — BP 138/80 | HR 78 | Temp 97.6°F | Ht 73.0 in | Wt 240.0 lb

## 2022-07-15 DIAGNOSIS — M7700 Medial epicondylitis, unspecified elbow: Secondary | ICD-10-CM | POA: Insufficient documentation

## 2022-07-15 DIAGNOSIS — E538 Deficiency of other specified B group vitamins: Secondary | ICD-10-CM | POA: Diagnosis not present

## 2022-07-15 DIAGNOSIS — R739 Hyperglycemia, unspecified: Secondary | ICD-10-CM

## 2022-07-15 DIAGNOSIS — Z125 Encounter for screening for malignant neoplasm of prostate: Secondary | ICD-10-CM

## 2022-07-15 DIAGNOSIS — Z0001 Encounter for general adult medical examination with abnormal findings: Secondary | ICD-10-CM | POA: Diagnosis not present

## 2022-07-15 DIAGNOSIS — M7702 Medial epicondylitis, left elbow: Secondary | ICD-10-CM | POA: Diagnosis not present

## 2022-07-15 DIAGNOSIS — E559 Vitamin D deficiency, unspecified: Secondary | ICD-10-CM

## 2022-07-15 DIAGNOSIS — S060X0A Concussion without loss of consciousness, initial encounter: Secondary | ICD-10-CM

## 2022-07-15 DIAGNOSIS — E7800 Pure hypercholesterolemia, unspecified: Secondary | ICD-10-CM | POA: Diagnosis not present

## 2022-07-15 DIAGNOSIS — L918 Other hypertrophic disorders of the skin: Secondary | ICD-10-CM

## 2022-07-15 DIAGNOSIS — I10 Essential (primary) hypertension: Secondary | ICD-10-CM

## 2022-07-15 DIAGNOSIS — M5416 Radiculopathy, lumbar region: Secondary | ICD-10-CM | POA: Insufficient documentation

## 2022-07-15 LAB — CBC WITH DIFFERENTIAL/PLATELET
Basophils Absolute: 0 10*3/uL (ref 0.0–0.1)
Basophils Relative: 0.5 % (ref 0.0–3.0)
Eosinophils Absolute: 0.1 10*3/uL (ref 0.0–0.7)
Eosinophils Relative: 2.3 % (ref 0.0–5.0)
HCT: 47.4 % (ref 39.0–52.0)
Hemoglobin: 15.6 g/dL (ref 13.0–17.0)
Lymphocytes Relative: 42.3 % (ref 12.0–46.0)
Lymphs Abs: 2.6 10*3/uL (ref 0.7–4.0)
MCHC: 32.9 g/dL (ref 30.0–36.0)
MCV: 80.9 fl (ref 78.0–100.0)
Monocytes Absolute: 0.6 10*3/uL (ref 0.1–1.0)
Monocytes Relative: 9.1 % (ref 3.0–12.0)
Neutro Abs: 2.8 10*3/uL (ref 1.4–7.7)
Neutrophils Relative %: 45.8 % (ref 43.0–77.0)
Platelets: 354 10*3/uL (ref 150.0–400.0)
RBC: 5.86 Mil/uL — ABNORMAL HIGH (ref 4.22–5.81)
RDW: 14.7 % (ref 11.5–15.5)
WBC: 6.2 10*3/uL (ref 4.0–10.5)

## 2022-07-15 LAB — LIPID PANEL
Cholesterol: 208 mg/dL — ABNORMAL HIGH (ref 0–200)
HDL: 32.3 mg/dL — ABNORMAL LOW (ref >39.00)
NonHDL: 175.91
Total CHOL/HDL Ratio: 6
Triglycerides: 277 mg/dL — ABNORMAL HIGH (ref 0.0–149.0)
VLDL: 55.4 mg/dL — ABNORMAL HIGH (ref 0.0–40.0)

## 2022-07-15 LAB — BASIC METABOLIC PANEL
BUN: 16 mg/dL (ref 6–23)
CO2: 27 mEq/L (ref 19–32)
Calcium: 9.6 mg/dL (ref 8.4–10.5)
Chloride: 101 mEq/L (ref 96–112)
Creatinine, Ser: 0.93 mg/dL (ref 0.40–1.50)
GFR: 92.99 mL/min (ref >60.00)
Glucose, Bld: 103 mg/dL — ABNORMAL HIGH (ref 70–99)
Potassium: 4.5 mEq/L (ref 3.5–5.1)
Sodium: 136 mEq/L (ref 135–145)

## 2022-07-15 LAB — HEPATIC FUNCTION PANEL
ALT: 28 U/L (ref 0–53)
AST: 19 U/L (ref 0–37)
Albumin: 4.2 g/dL (ref 3.5–5.2)
Alkaline Phosphatase: 52 U/L (ref 39–117)
Bilirubin, Direct: 0 mg/dL (ref 0.0–0.3)
Total Bilirubin: 0.3 mg/dL (ref 0.2–1.2)
Total Protein: 7.1 g/dL (ref 6.0–8.3)

## 2022-07-15 LAB — VITAMIN B12: Vitamin B-12: 326 pg/mL (ref 211–911)

## 2022-07-15 LAB — LDL CHOLESTEROL, DIRECT: Direct LDL: 114 mg/dL

## 2022-07-15 LAB — TSH: TSH: 1.55 u[IU]/mL (ref 0.35–5.50)

## 2022-07-15 LAB — HEMOGLOBIN A1C: Hgb A1c MFr Bld: 6.4 % (ref 4.6–6.5)

## 2022-07-15 LAB — VITAMIN D 25 HYDROXY (VIT D DEFICIENCY, FRACTURES): VITD: 20.15 ng/mL — ABNORMAL LOW (ref 30.00–100.00)

## 2022-07-15 LAB — PSA: PSA: 0.37 ng/mL (ref 0.10–4.00)

## 2022-07-15 MED ORDER — ACETAMINOPHEN-CODEINE 300-30 MG PO TABS: 1.0000 | ORAL_TABLET | Freq: Four times a day (QID) | ORAL | 2 refills | Status: DC | PRN

## 2022-07-15 MED ORDER — GABAPENTIN 100 MG PO CAPS: 100.0000 mg | ORAL_CAPSULE | Freq: Three times a day (TID) | ORAL | 5 refills | Status: DC

## 2022-07-15 MED ORDER — ATORVASTATIN CALCIUM 80 MG PO TABS: 80.0000 mg | ORAL_TABLET | Freq: Every day | ORAL | 3 refills | Status: DC
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
## 2022-07-15 NOTE — Telephone Encounter (Signed)
Patient called asked for a call back as soon as possible. Patient said he has been playing phone tag. The number to contact patient is [phone]

## 2022-07-15 NOTE — Patient Instructions (Signed)
Please take all new medication as prescribed - the gabapentin 100 mg three times per day  Please continue all other medications as before, and refills have been done if requested - the tylenol #3 Please have the pharmacy call with any other refills you may need.  Please continue your efforts at being more active, low cholesterol diet, and weight control.  You are otherwise up to date with prevention measures today.  Please keep your appointments with your specialists as you may have planned - orthopedic for the lower back, but also sports medicine for the concussion and left inner elbow pain  Please continue all other medications as before, and refills have been done if requested.  Please have the pharmacy call with any other refills you may need.  Please continue your efforts at being more active, low cholesterol diet, and weight control.  You are otherwise up to date with prevention measures today.  Please keep your appointments with your specialists as you may have planned  You will be contacted regarding the referral for: Dermatology  Please go to the LAB at the blood drawing area for the tests to be done  You will be contacted by phone if any changes need to be made immediately.  Otherwise, you will receive a letter about your results with an explanation, but please check with MyChart first.  Please remember to sign up for MyChart if you have not done so, as this will be important to you in the future with finding out test results, communicating by private email, and scheduling acute appointments online when needed.  Please make an Appointment to return in 6 months, or sooner if needed

## 2022-07-16 ENCOUNTER — Encounter: Payer: Self-pay | Admitting: Internal Medicine

## 2022-07-16 NOTE — Assessment & Plan Note (Signed)
Mild to mod, for sport med referral, may benefit from cortisone ing, to f/u any worsening symptoms or concerns

## 2022-07-16 NOTE — Telephone Encounter (Signed)
Appt scheduled

## 2022-07-16 NOTE — Assessment & Plan Note (Signed)
Also f/u sport med as planned

## 2022-07-16 NOTE — Assessment & Plan Note (Signed)
Last vitamin D Lab Results  Component Value Date   VD25OH 20.15 (L) 07/15/2022   Low, to start oral replacement  

## 2022-07-16 NOTE — Assessment & Plan Note (Signed)
Lab Results  Component Value Date   LDLCALC 85 02/11/2022   Uncontrolled,, pt to increase lipitor to 80 mg

## 2022-07-16 NOTE — Assessment & Plan Note (Signed)
BP Readings from Last 3 Encounters:  07/15/22 138/80  06/26/22 (!) 164/88  06/05/22 (!) 148/86   Stable, pt to continue medical treatment norvasc 5 qd

## 2022-07-16 NOTE — Assessment & Plan Note (Signed)
Mild to mod, for derm refrral,  to f/u any worsening symptoms or concerns

## 2022-07-16 NOTE — Assessment & Plan Note (Signed)
Age and sex appropriate education and counseling updated with regular exercise and diet Referrals for preventative services - declines pulm referral for LDCT for now Immunizations addressed - declines covid booster, for shingrix at pharmacy Smoking counseling  - none needed Evidence for depression or other mood disorder - none significant Most recent labs reviewed. I have personally reviewed and have noted: 1) the patient's medical and social history 2) The patient's current medications and supplements 3) The patient's height, weight, and BMI have been recorded in the chart

## 2022-07-16 NOTE — Assessment & Plan Note (Signed)
Persistent mild to mod, for refill tylenol #3 prn, add gabaentin 100 tid, and f/u orthopedic as planned

## 2022-07-18 ENCOUNTER — Ambulatory Visit: Payer: Managed Care, Other (non HMO) | Admitting: Physical Medicine and Rehabilitation

## 2022-07-18 ENCOUNTER — Encounter: Payer: Self-pay | Admitting: Physical Medicine and Rehabilitation

## 2022-07-18 DIAGNOSIS — G8929 Other chronic pain: Secondary | ICD-10-CM

## 2022-07-18 DIAGNOSIS — M5416 Radiculopathy, lumbar region: Secondary | ICD-10-CM

## 2022-07-18 DIAGNOSIS — M5442 Lumbago with sciatica, left side: Secondary | ICD-10-CM | POA: Diagnosis not present

## 2022-07-18 DIAGNOSIS — M4726 Other spondylosis with radiculopathy, lumbar region: Secondary | ICD-10-CM

## 2022-07-18 DIAGNOSIS — M5441 Lumbago with sciatica, right side: Secondary | ICD-10-CM | POA: Diagnosis not present
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
## 2022-07-18 NOTE — Progress Notes (Signed)
   07/18/22 [place] in the past year? 0  Number of falls in past year 0  Was there an injury with Fall? 0  Fall Risk Category Calculator 0  Fall Risk  Patient at Risk for Falls Due to No Fall Risks  Fall risk Follow up Falls prevention discussed

## 2022-07-23 ENCOUNTER — Ambulatory Visit: Payer: Managed Care, Other (non HMO) | Admitting: Physical Medicine and Rehabilitation

## 2022-07-23 ENCOUNTER — Ambulatory Visit: Payer: Self-pay

## 2022-07-23 VITALS — BP 123/86 | HR 73

## 2022-07-23 DIAGNOSIS — M5416 Radiculopathy, lumbar region: Secondary | ICD-10-CM

## 2022-07-23 MED ORDER — METHYLPREDNISOLONE ACETATE 80 MG/ML IJ SUSP: 80.0000 mg | Freq: Once | INTRAMUSCULAR | Status: DC

## 2022-07-23 NOTE — Progress Notes (Signed)
Functional Pain Scale - descriptive words and definitions  Uncomfortable (3)  Pain is present but can complete all ADL's/sleep is slightly affected and passive distraction only gives marginal relief. Mild range order  Average Pain 5   +Driver, -BT, -Dye Allergies.  Lower back pain that goes all the way across and radiates down both legs

## 2022-07-23 NOTE — Patient Instructions (Signed)

## 2022-07-24 ENCOUNTER — Ambulatory Visit (INDEPENDENT_AMBULATORY_CARE_PROVIDER_SITE_OTHER): Payer: Managed Care, Other (non HMO) | Admitting: Family Medicine

## 2022-07-24 VITALS — BP 130/86 | HR 81 | Ht 73.0 in | Wt 243.0 lb

## 2022-07-24 DIAGNOSIS — S060X0D Concussion without loss of consciousness, subsequent encounter: Secondary | ICD-10-CM | POA: Diagnosis not present

## 2022-07-24 MED ORDER — TOPIRAMATE 25 MG PO TABS: 25.0000 mg | ORAL_TABLET | Freq: Two times a day (BID) | ORAL | 3 refills | Status: DC

## 2022-07-24 NOTE — Patient Instructions (Signed)
Thank you for coming in today.   Recheck in 2 months.   I think that will probably be the last visit for concussion.  Continue topamax '25mg'$  twice daily.   As for your back pain continue current treatment with Dr Ernestina Patches and your chiropractor.   They are managing that and work notes should best be done by them.
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
## 2022-07-29 ENCOUNTER — Ambulatory Visit: Payer: Managed Care, Other (non HMO) | Admitting: Pulmonary Disease

## 2022-07-30 NOTE — Procedures (Signed)
Lumbar Epidural Steroid Injection - Interlaminar Approach with Fluoroscopic Guidance  Patient: Timothy Fuller      Date of Birth: April 26, 1966 MRN: [id number] PCP: Biagio Borg, MD      Visit Date: 07/23/2022   Universal Protocol:     Consent Given By: the patient  Position: PRONE  Additional Comments: Vital signs were monitored before and after the procedure. Patient was prepped and draped in the usual sterile fashion. The correct patient, procedure, and site was verified.   Injection Procedure Details:   Procedure diagnoses: Lumbar radiculopathy [M54.16]   Meds Administered:  Meds ordered this encounter  Medications   DISCONTD: methylPREDNISolone acetate (DEPO-MEDROL) injection 80 mg     Laterality: Left  Location/Site:  L5-S1  Needle: 3.5 in., 20 ga. Tuohy  Needle Placement: Paramedian epidural  Findings:   -Comments: Excellent flow of contrast into the epidural space.  Procedure Details: Using a paramedian approach from the side mentioned above, the region overlying the inferior lamina was localized under fluoroscopic visualization and the soft tissues overlying this structure were infiltrated with 4 ml. of 1% Lidocaine without Epinephrine. The Tuohy needle was inserted into the epidural space using a paramedian approach.   The epidural space was localized using loss of resistance along with counter oblique bi-planar fluoroscopic views.  After negative aspirate for air, blood, and CSF, a 2 ml. volume of Isovue-250 was injected into the epidural space and the flow of contrast was observed. Radiographs were obtained for documentation purposes.    The injectate was administered into the level noted above.   Additional Comments:  The patient tolerated the procedure well Dressing: 2 x 2 sterile gauze and Band-Aid    Post-procedure details: Patient was observed during the procedure. Post-procedure instructions were reviewed.  Patient left the clinic in stable  condition.

## 2022-07-30 NOTE — Progress Notes (Signed)
Timothy Fuller - 57 y.o. male MRN [id number]  Date of birth: 1966/06/03  Office Visit Note: Visit Date: 07/23/2022 PCP: Biagio Borg, MD Referred by: Biagio Borg, MD  Subjective: Chief Complaint  Patient presents with   Lower Back - Pain   HPI:  Timothy Fuller is a 57 y.o. male who comes in today at the request of Barnet Pall, FNP for planned Left L5-S1 Lumbar Interlaminar epidural steroid injection with fluoroscopic guidance.  The patient has failed conservative care including home exercise, medications, time and activity modification.  This injection will be diagnostic and hopefully therapeutic.  Please see requesting physician notes for further details and justification.   ROS Otherwise per HPI.  Assessment & Plan: Visit Diagnoses:    ICD-10-CM   1. Lumbar radiculopathy  M54.16 XR C-ARM NO REPORT    Epidural Steroid injection    DISCONTINUED: methylPREDNISolone acetate (DEPO-MEDROL) injection 80 mg      Plan: No additional findings.   Meds & Orders:  Meds ordered this encounter  Medications   DISCONTD: methylPREDNISolone acetate (DEPO-MEDROL) injection 80 mg    Orders Placed This Encounter  Procedures   XR C-ARM NO REPORT   Epidural Steroid injection    Follow-up: Return for visit to requesting provider as needed.   Procedures: No procedures performed  Lumbar Epidural Steroid Injection - Interlaminar Approach with Fluoroscopic Guidance  Patient: Timothy Fuller      Date of Birth: 08-06-1965 MRN: [id number] PCP: Biagio Borg, MD      Visit Date: 07/23/2022   Universal Protocol:     Consent Given By: the patient  Position: PRONE  Additional Comments: Vital signs were monitored before and after the procedure. Patient was prepped and draped in the usual sterile fashion. The correct patient, procedure, and site was verified.   Injection Procedure Details:   Procedure diagnoses: Lumbar radiculopathy [M54.16]   Meds Administered:  Meds ordered this encounter   Medications   DISCONTD: methylPREDNISolone acetate (DEPO-MEDROL) injection 80 mg     Laterality: Left  Location/Site:  L5-S1  Needle: 3.5 in., 20 ga. Tuohy  Needle Placement: Paramedian epidural  Findings:   -Comments: Excellent flow of contrast into the epidural space.  Procedure Details: Using a paramedian approach from the side mentioned above, the region overlying the inferior lamina was localized under fluoroscopic visualization and the soft tissues overlying this structure were infiltrated with 4 ml. of 1% Lidocaine without Epinephrine. The Tuohy needle was inserted into the epidural space using a paramedian approach.   The epidural space was localized using loss of resistance along with counter oblique bi-planar fluoroscopic views.  After negative aspirate for air, blood, and CSF, a 2 ml. volume of Isovue-250 was injected into the epidural space and the flow of contrast was observed. Radiographs were obtained for documentation purposes.    The injectate was administered into the level noted above.   Additional Comments:  The patient tolerated the procedure well Dressing: 2 x 2 sterile gauze and Band-Aid    Post-procedure details: Patient was observed during the procedure. Post-procedure instructions were reviewed.  Patient left the clinic in stable condition.   Clinical History: Lumbar MRI Imaging ([place]) 05/28/2022   Impression: 1. 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 No mass effect on the cord or nerve roots. 2. 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 3. Otherwise normal     Objective:  VS:  HT:    WT:   BMI:  BP:123/86  HR:73bpm  TEMP: ( )  RESP:  Physical Exam Vitals and nursing note reviewed.  Constitutional:      General: He is not in acute distress.    Appearance: Normal appearance. He is not ill-appearing.  HENT:     Head: Normocephalic and atraumatic.     Right Ear: External ear  normal.     Left Ear: External ear normal.     Nose: No congestion.  Eyes:     Extraocular Movements: Extraocular movements intact.  Cardiovascular:     Rate and Rhythm: Normal rate.     Pulses: Normal pulses.  Pulmonary:     Effort: Pulmonary effort is normal. No respiratory distress.  Abdominal:     General: There is no distension.     Palpations: Abdomen is soft.  Musculoskeletal:        General: No tenderness or signs of injury.     Cervical back: Neck supple.     Right lower leg: No edema.     Left lower leg: No edema.     Comments: Patient has good distal strength without clonus.  Skin:    Findings: No erythema or rash.  Neurological:     General: No focal deficit present.     Mental Status: He is alert and oriented to person, place, and time.     Sensory: No sensory deficit.     Motor: No weakness or abnormal muscle tone.     Coordination: Coordination normal.  Psychiatric:        Mood and Affect: Mood normal.        Behavior: Behavior normal.      Imaging: No results found.
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
## 2022-08-06 ENCOUNTER — Encounter: Payer: Self-pay | Admitting: Family Medicine

## 2022-08-06 ENCOUNTER — Ambulatory Visit (INDEPENDENT_AMBULATORY_CARE_PROVIDER_SITE_OTHER): Payer: Managed Care, Other (non HMO) | Admitting: Family Medicine

## 2022-08-06 VITALS — BP 136/82 | HR 88 | Ht 73.0 in | Wt 240.0 lb

## 2022-08-06 DIAGNOSIS — G4701 Insomnia due to medical condition: Secondary | ICD-10-CM

## 2022-08-06 DIAGNOSIS — S060X0D Concussion without loss of consciousness, subsequent encounter: Secondary | ICD-10-CM | POA: Diagnosis not present

## 2022-08-06 MED ORDER — TRAZODONE HCL 50 MG PO TABS: 50.0000 mg | ORAL_TABLET | Freq: Every evening | ORAL | 3 refills | Status: DC | PRN

## 2022-08-06 NOTE — Patient Instructions (Addendum)
Thank you for coming in today.   For sleep lets try trazodone at bedtime.  It will help you stay asleep.   Let me know.     Look at you Healthy Living: Sleep In this video, you will learn why sleep is an important part of a healthy lifestyle. To view the content, go to this web address: https://pe.elsevier.682 336 4325  This video will expire on: 03/05/2024. If you need access to this video following this date, please reach out to the healthcare provider who assigned it to you. This information is not intended to replace advice given to you by your health care provider. Make sure you discuss any questions you have with your health care provider. Elsevier Patient Education  [place]. .

## 2022-08-14 ENCOUNTER — Telehealth: Payer: Self-pay | Admitting: Family Medicine

## 2022-08-14 MED ORDER — QUETIAPINE FUMARATE 25 MG PO TABS: 25.0000 mg | ORAL_TABLET | Freq: Every evening | ORAL | 1 refills | Status: DC | PRN

## 2022-08-14 NOTE — Telephone Encounter (Signed)
Called patient and left VM that I will send Dr. Clovis Riley response through [place].

## 2022-08-14 NOTE — Telephone Encounter (Signed)
I have prescribed a medicine called quetiapine.  Take it at bedtime as needed for sleep.  Take 1 or 2 pills as needed.  I can increase the dose if needed.  I sent my office notes and a letter on February 6 to your insurance company

## 2022-08-20 ENCOUNTER — Telehealth: Payer: Self-pay | Admitting: Physical Medicine and Rehabilitation

## 2022-08-20 NOTE — Telephone Encounter (Signed)
Patient would like to make an appointment with Dr Ernestina Patches  WW:[phone]

## 2022-08-21 NOTE — Telephone Encounter (Signed)
LVM to return call to clinic.

## 2022-08-22 NOTE — Telephone Encounter (Signed)
Spoke with patient and he is having the same type of pain and is requesting another injection. Patient states the last injection lasted about a week and gave about 30% relief. He also requested another medication other than Tylenol #3. Please advise

## 2022-08-23 NOTE — Telephone Encounter (Signed)
Spoke with patient and scheduled ov for 08/28/22

## 2022-08-27 ENCOUNTER — Other Ambulatory Visit: Payer: Self-pay | Admitting: Family Medicine

## 2022-08-27 ENCOUNTER — Ambulatory Visit: Payer: Managed Care, Other (non HMO) | Admitting: Internal Medicine

## 2022-08-27 NOTE — Telephone Encounter (Signed)
Rx refill request approved per Dr. Corey's orders. 

## 2022-08-28 ENCOUNTER — Encounter: Payer: Self-pay | Admitting: Physical Medicine and Rehabilitation

## 2022-08-28 ENCOUNTER — Ambulatory Visit: Payer: Managed Care, Other (non HMO) | Admitting: Physical Medicine and Rehabilitation

## 2022-08-28 VITALS — BP 122/85 | HR 111

## 2022-08-28 DIAGNOSIS — G894 Chronic pain syndrome: Secondary | ICD-10-CM | POA: Diagnosis not present

## 2022-08-28 DIAGNOSIS — M5441 Lumbago with sciatica, right side: Secondary | ICD-10-CM

## 2022-08-28 DIAGNOSIS — G8929 Other chronic pain: Secondary | ICD-10-CM

## 2022-08-28 DIAGNOSIS — M5416 Radiculopathy, lumbar region: Secondary | ICD-10-CM

## 2022-08-28 DIAGNOSIS — M5442 Lumbago with sciatica, left side: Secondary | ICD-10-CM

## 2022-08-28 MED ORDER — OXYCODONE-ACETAMINOPHEN 5-325 MG PO TABS: 1.0000 | ORAL_TABLET | Freq: Three times a day (TID) | ORAL | 0 refills | Status: DC | PRN

## 2022-08-28 NOTE — Progress Notes (Unsigned)
Inj helped only 1 week. 60% relief during that week. LBP with numbness and tingling down left leg. Taking hydrocodone which only helps some. Says needs something done so he can get back to work. Works at [employer] and on feet all day
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
## 2022-08-29 ENCOUNTER — Encounter: Payer: Self-pay | Admitting: Internal Medicine

## 2022-08-29 ENCOUNTER — Ambulatory Visit: Payer: Managed Care, Other (non HMO) | Admitting: Internal Medicine

## 2022-08-29 VITALS — BP 128/76 | HR 91 | Temp 97.8°F | Ht 73.0 in | Wt 240.0 lb

## 2022-08-29 DIAGNOSIS — E7800 Pure hypercholesterolemia, unspecified: Secondary | ICD-10-CM | POA: Diagnosis not present

## 2022-08-29 DIAGNOSIS — M5416 Radiculopathy, lumbar region: Secondary | ICD-10-CM | POA: Diagnosis not present

## 2022-08-29 DIAGNOSIS — E559 Vitamin D deficiency, unspecified: Secondary | ICD-10-CM | POA: Diagnosis not present

## 2022-08-29 NOTE — Assessment & Plan Note (Signed)
Last vitamin D Lab Results  Component Value Date   VD25OH 20.15 (L) 07/15/2022   Low, to start oral replacement

## 2022-08-29 NOTE — Assessment & Plan Note (Signed)
Pt for l5-s1 injection soon, has been started on oxycodone 5 mg bid prn per ortho #20 so will let us know if needs further after this, and work letter given today
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
## 2022-08-29 NOTE — Assessment & Plan Note (Addendum)
Lab Results  Component Value Date   LDLCALC 85 02/11/2022   uncontrolled, pt to continue current statin lipitor 80 mg, declines lab f/u today

## 2022-08-29 NOTE — Patient Instructions (Signed)
You are given the work note today  Please continue all other medications as before  Please have the pharmacy call with any other refills you may need.  Please continue your efforts at being more active, low cholesterol diet, and weight control.  Please keep your appointments with your specialists as you may have planned  Please make an Appointment to return in 2 weeks if needed

## 2022-09-02 ENCOUNTER — Ambulatory Visit: Payer: Managed Care, Other (non HMO) | Admitting: Internal Medicine

## 2022-09-02 ENCOUNTER — Encounter: Payer: Self-pay | Admitting: Internal Medicine

## 2022-09-02 VITALS — BP 128/78 | HR 76 | Temp 98.0°F | Ht 73.0 in | Wt 240.0 lb

## 2022-09-02 DIAGNOSIS — I10 Essential (primary) hypertension: Secondary | ICD-10-CM | POA: Diagnosis not present

## 2022-09-02 DIAGNOSIS — F32A Depression, unspecified: Secondary | ICD-10-CM

## 2022-09-02 DIAGNOSIS — M5416 Radiculopathy, lumbar region: Secondary | ICD-10-CM

## 2022-09-02 DIAGNOSIS — E559 Vitamin D deficiency, unspecified: Secondary | ICD-10-CM

## 2022-09-02 MED ORDER — OXYCODONE-ACETAMINOPHEN 5-325 MG PO TABS: 1.0000 | ORAL_TABLET | Freq: Three times a day (TID) | ORAL | 0 refills | Status: DC | PRN

## 2022-09-02 NOTE — Assessment & Plan Note (Signed)
Pt for l5-s1 injection soon, pt for limited refill oxycodone 5 mg bid prn per ortho #20, new work letter given today

## 2022-09-02 NOTE — Patient Instructions (Signed)
Your work note was updated today  Please continue all other medications as before, and refills have been done if requested - the percocet to [facility]  Please have the pharmacy call with any other refills you may need.  Please keep your appointments with your specialists as you may have planned

## 2022-09-02 NOTE — Assessment & Plan Note (Signed)
Stable, no SI or HI, declines need for any change in tx or counseling at this time

## 2022-09-02 NOTE — Assessment & Plan Note (Signed)
Last vitamin D Lab Results  Component Value Date   VD25OH 20.15 (L) 07/15/2022   Low, to start oral replacement

## 2022-09-02 NOTE — Assessment & Plan Note (Signed)
BP Readings from Last 3 Encounters:  09/02/22 128/78  08/29/22 128/76  08/28/22 122/85   Stable, pt to continue medical treatment norvasc 5 mg qd
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
## 2022-09-05 ENCOUNTER — Ambulatory Visit: Payer: Managed Care, Other (non HMO) | Admitting: Physical Medicine and Rehabilitation

## 2022-09-05 ENCOUNTER — Ambulatory Visit: Payer: Self-pay

## 2022-09-05 VITALS — BP 142/90 | HR 83

## 2022-09-05 DIAGNOSIS — M5416 Radiculopathy, lumbar region: Secondary | ICD-10-CM | POA: Diagnosis not present

## 2022-09-05 DIAGNOSIS — G894 Chronic pain syndrome: Secondary | ICD-10-CM

## 2022-09-05 MED ORDER — METHYLPREDNISOLONE ACETATE 80 MG/ML IJ SUSP
80.0000 mg | Freq: Once | INTRAMUSCULAR | Status: AC
  Administered 2022-09-05: 80 mg

## 2022-09-05 NOTE — Progress Notes (Signed)
Functional Pain Scale - descriptive words and definitions  Distracting (5)    Aware of pain/able to complete some ADL's but limited by pain/sleep is affected and active distractions are only slightly useful. Moderate range order  Average Pain 5   +Driver, -BT, -Dye Allergies.  Lower back pain all the way across the back that is radiating down the legs  Needs referral to pain mgt, back to work

## 2022-09-05 NOTE — Patient Instructions (Signed)

## 2022-09-12 ENCOUNTER — Ambulatory Visit: Payer: Managed Care, Other (non HMO) | Admitting: Internal Medicine

## 2022-09-12 VITALS — BP 126/74 | HR 85 | Temp 97.7°F | Ht 73.0 in | Wt 238.0 lb

## 2022-09-12 DIAGNOSIS — I10 Essential (primary) hypertension: Secondary | ICD-10-CM

## 2022-09-12 DIAGNOSIS — E559 Vitamin D deficiency, unspecified: Secondary | ICD-10-CM

## 2022-09-12 DIAGNOSIS — M5416 Radiculopathy, lumbar region: Secondary | ICD-10-CM

## 2022-09-12 MED ORDER — OXYCODONE-ACETAMINOPHEN 5-325 MG PO TABS: 1.0000 | ORAL_TABLET | Freq: Three times a day (TID) | ORAL | 0 refills | Status: DC | PRN

## 2022-09-12 NOTE — Patient Instructions (Addendum)
You are give the work note today  Please continue all other medications as before, and refills have been done if requested - the percocet  Please have the pharmacy call with any other refills you may need.  Please continue your efforts at being more active, low cholesterol diet, and weight control.  Please keep your appointments with your specialists as you may have planned - pain management  Ok to cancel your appt in 2 weeks
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
## 2022-09-15 ENCOUNTER — Encounter: Payer: Self-pay | Admitting: Internal Medicine

## 2022-09-15 NOTE — Assessment & Plan Note (Signed)
BP Readings from Last 3 Encounters:  09/12/22 126/74  09/05/22 (!) 142/90  09/02/22 128/78   Stable, pt to continue medical treatment norvasc 5 mg qd

## 2022-09-15 NOTE — Assessment & Plan Note (Signed)
Persistent mod to severe pain ongoing, for percocet prn bridging tx only, f/u pain management as planned, work note given today

## 2022-09-15 NOTE — Assessment & Plan Note (Signed)
Last vitamin D Lab Results  Component Value Date   VD25OH 20.15 (L) 07/15/2022   Low, to start oral replacement 

## 2022-09-16 NOTE — Procedures (Signed)
Lumbar Epidural Steroid Injection - Interlaminar Approach with Fluoroscopic Guidance  Patient: Timothy Fuller      Date of Birth: Oct 23, 1965 MRN: [id number] PCP: Biagio Borg, MD      Visit Date: 09/05/2022   Universal Protocol:     Consent Given By: the patient  Position: PRONE  Additional Comments: Vital signs were monitored before and after the procedure. Patient was prepped and draped in the usual sterile fashion. The correct patient, procedure, and site was verified.   Injection Procedure Details:   Procedure diagnoses: Lumbar radiculopathy [M54.16]   Meds Administered:  Meds ordered this encounter  Medications   methylPREDNISolone acetate (DEPO-MEDROL) injection 80 mg     Laterality: Left  Location/Site:  L5-S1  Needle: 4.5 in., 20 ga. Tuohy  Needle Placement: Paramedian epidural  Findings:   -Comments: Excellent flow of contrast into the epidural space.  Procedure Details: Using a paramedian approach from the side mentioned above, the region overlying the inferior lamina was localized under fluoroscopic visualization and the soft tissues overlying this structure were infiltrated with 4 ml. of 1% Lidocaine without Epinephrine. The Tuohy needle was inserted into the epidural space using a paramedian approach.   The epidural space was localized using loss of resistance along with counter oblique bi-planar fluoroscopic views.  After negative aspirate for air, blood, and CSF, a 2 ml. volume of Isovue-250 was injected into the epidural space and the flow of contrast was observed. Radiographs were obtained for documentation purposes.    The injectate was administered into the level noted above.   Additional Comments:  No complications occurred Dressing: 2 x 2 sterile gauze and Band-Aid    Post-procedure details: Patient was observed during the procedure. Post-procedure instructions were reviewed.  Patient left the clinic in stable condition.

## 2022-09-16 NOTE — Progress Notes (Signed)
Timothy Fuller - 57 y.o. male MRN [id number]  Date of birth: [birth year]  Office Visit Note: Visit Date: 09/05/2022 PCP: Biagio Borg, MD Referred by: Biagio Borg, MD  Subjective: Chief Complaint  Patient presents with   Lower Back - Pain   HPI:  Timothy Fuller is a 57 y.o. male who comes in today at the request of Barnet Pall, FNP and Dr. Jean Rosenthal for planned Left L5-S1 Lumbar Interlaminar epidural steroid injection with fluoroscopic guidance.  The patient has failed conservative care including home exercise, medications, time and activity modification.  This injection will be diagnostic and hopefully therapeutic.  Please see requesting physician notes for further details and justification.   ROS Otherwise per HPI.  Assessment & Plan: Visit Diagnoses:    ICD-10-CM   1. Lumbar radiculopathy  M54.16 XR C-ARM NO REPORT    Epidural Steroid injection    methylPREDNISolone acetate (DEPO-MEDROL) injection 80 mg    Ambulatory referral to Pain Clinic    2. Chronic pain syndrome  G89.4 Ambulatory referral to Pain Clinic      Plan: No additional findings.   Meds & Orders:  Meds ordered this encounter  Medications   methylPREDNISolone acetate (DEPO-MEDROL) injection 80 mg    Orders Placed This Encounter  Procedures   XR C-ARM NO REPORT   Ambulatory referral to Pain Clinic   Epidural Steroid injection    Follow-up: Return for visit to requesting provider as needed.   Procedures: No procedures performed  Lumbar Epidural Steroid Injection - Interlaminar Approach with Fluoroscopic Guidance  Patient: Timothy Fuller      Date of Birth: Dec 24, 1965 MRN: [id number] PCP: Biagio Borg, MD      Visit Date: 09/05/2022   Universal Protocol:     Consent Given By: the patient  Position: PRONE  Additional Comments: Vital signs were monitored before and after the procedure. Patient was prepped and draped in the usual sterile fashion. The correct patient, procedure, and site was  verified.   Injection Procedure Details:   Procedure diagnoses: Lumbar radiculopathy [M54.16]   Meds Administered:  Meds ordered this encounter  Medications   methylPREDNISolone acetate (DEPO-MEDROL) injection 80 mg     Laterality: Left  Location/Site:  L5-S1  Needle: 4.5 in., 20 ga. Tuohy  Needle Placement: Paramedian epidural  Findings:   -Comments: Excellent flow of contrast into the epidural space.  Procedure Details: Using a paramedian approach from the side mentioned above, the region overlying the inferior lamina was localized under fluoroscopic visualization and the soft tissues overlying this structure were infiltrated with 4 ml. of 1% Lidocaine without Epinephrine. The Tuohy needle was inserted into the epidural space using a paramedian approach.   The epidural space was localized using loss of resistance along with counter oblique bi-planar fluoroscopic views.  After negative aspirate for air, blood, and CSF, a 2 ml. volume of Isovue-250 was injected into the epidural space and the flow of contrast was observed. Radiographs were obtained for documentation purposes.    The injectate was administered into the level noted above.   Additional Comments:  No complications occurred Dressing: 2 x 2 sterile gauze and Band-Aid    Post-procedure details: Patient was observed during the procedure. Post-procedure instructions were reviewed.  Patient left the clinic in stable condition.   Clinical History: Lumbar MRI Imaging ([facility]) 05/28/2022   Impression: 1. 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 No mass effect on the cord or nerve roots.  2. 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 3. Otherwise normal     Objective:  VS:  HT:    WT:   BMI:     BP:(!) 142/90  HR:83bpm  TEMP: ( )  RESP:  Physical Exam Vitals and nursing note reviewed.  Constitutional:      General: He is not in acute distress.     Appearance: Normal appearance. He is obese. He is not ill-appearing.  HENT:     Head: Normocephalic and atraumatic.     Right Ear: External ear normal.     Left Ear: External ear normal.     Nose: No congestion.  Eyes:     Extraocular Movements: Extraocular movements intact.  Cardiovascular:     Rate and Rhythm: Normal rate.     Pulses: Normal pulses.  Pulmonary:     Effort: Pulmonary effort is normal. No respiratory distress.  Abdominal:     General: There is no distension.     Palpations: Abdomen is soft.  Musculoskeletal:        General: No tenderness or signs of injury.     Cervical back: Neck supple.     Right lower leg: No edema.     Left lower leg: No edema.     Comments: Patient has good distal strength without clonus.  Skin:    Findings: No erythema or rash.  Neurological:     General: No focal deficit present.     Mental Status: He is alert and oriented to person, place, and time.     Sensory: No sensory deficit.     Motor: No weakness or abnormal muscle tone.     Coordination: Coordination normal.  Psychiatric:        Mood and Affect: Mood normal.        Behavior: Behavior normal.      Imaging: No results found.

## 2022-09-17 LAB — LAB REPORT - SCANNED: EGFR: 112

## 2022-09-26 ENCOUNTER — Ambulatory Visit (INDEPENDENT_AMBULATORY_CARE_PROVIDER_SITE_OTHER): Payer: Managed Care, Other (non HMO) | Admitting: Internal Medicine

## 2022-09-26 ENCOUNTER — Other Ambulatory Visit: Payer: Self-pay

## 2022-09-26 VITALS — BP 130/76 | HR 77 | Temp 97.8°F | Ht 73.0 in | Wt 242.0 lb

## 2022-09-26 DIAGNOSIS — F32A Depression, unspecified: Secondary | ICD-10-CM | POA: Diagnosis not present

## 2022-09-26 DIAGNOSIS — E559 Vitamin D deficiency, unspecified: Secondary | ICD-10-CM | POA: Diagnosis not present

## 2022-09-26 DIAGNOSIS — I10 Essential (primary) hypertension: Secondary | ICD-10-CM

## 2022-09-26 DIAGNOSIS — M5416 Radiculopathy, lumbar region: Secondary | ICD-10-CM | POA: Diagnosis not present

## 2022-09-26 MED ORDER — ALPRAZOLAM 1 MG PO TABS: ORAL_TABLET | ORAL | 2 refills | Status: DC

## 2022-09-26 MED ORDER — OXYCODONE-ACETAMINOPHEN 5-325 MG PO TABS: 1.0000 | ORAL_TABLET | Freq: Three times a day (TID) | ORAL | 0 refills | Status: DC | PRN

## 2022-09-26 NOTE — Patient Instructions (Signed)
Ok for further bridge medication for pain - sent to the pharamcy  Hopefully you would get any further pain control with your pain management provider  You are given the work note  Please continue all other medications as before, and refills have been done if requested.  Please have the pharmacy call with any other refills you may need.  Please keep your appointments with your specialists as you may have planned
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
## 2022-09-27 ENCOUNTER — Other Ambulatory Visit: Payer: Self-pay

## 2022-09-27 ENCOUNTER — Emergency Department (HOSPITAL_BASED_OUTPATIENT_CLINIC_OR_DEPARTMENT_OTHER): Payer: Managed Care, Other (non HMO)

## 2022-09-27 ENCOUNTER — Encounter (HOSPITAL_BASED_OUTPATIENT_CLINIC_OR_DEPARTMENT_OTHER): Payer: Self-pay | Admitting: Emergency Medicine

## 2022-09-27 ENCOUNTER — Emergency Department (HOSPITAL_BASED_OUTPATIENT_CLINIC_OR_DEPARTMENT_OTHER)
Admission: EM | Admit: 2022-09-27 | Discharge: 2022-09-27 | Disposition: A | Discharge status: Home / Self Care | Payer: Managed Care, Other (non HMO) | Attending: Emergency Medicine | Admitting: Emergency Medicine

## 2022-09-27 DIAGNOSIS — I10 Essential (primary) hypertension: Secondary | ICD-10-CM | POA: Insufficient documentation

## 2022-09-27 DIAGNOSIS — X58XXXA Exposure to other specified factors, initial encounter: Secondary | ICD-10-CM | POA: Diagnosis not present

## 2022-09-27 DIAGNOSIS — Z79899 Other long term (current) drug therapy: Secondary | ICD-10-CM | POA: Insufficient documentation

## 2022-09-27 DIAGNOSIS — S99922A Unspecified injury of left foot, initial encounter: Secondary | ICD-10-CM | POA: Diagnosis present

## 2022-09-27 NOTE — Discharge Instructions (Signed)
As we discussed, your workup in the ER today was reassuring for acute findings.  X-ray imaging of your toe did not reveal any fracture or dislocation.  I recommend that you rest, ice, compress, and elevate your foot and take Tylenol/ibuprofen as needed for pain.  Please also use the bacitracin and Band-Aids that you were given for your wound and be sure to rinse your wound with soap and water and ensure that it stays clean and dry to facilitate adequate healing.  Return if development of any new or worsening symptoms.

## 2022-09-27 NOTE — ED Triage Notes (Signed)
Left fifth toe injury 1 week ago ., persistent pain .

## 2022-09-27 NOTE — ED Notes (Signed)
Pt left without discharge instructions.

## 2022-09-27 NOTE — ED Provider Notes (Signed)
[place] [facility] Provider Note   CSN: [id number] Arrival date & time: 09/27/22  1135     History  Chief Complaint  Patient presents with   Toe Injury    fifth    Timothy Fuller is a 57 y.o. male.  Patient with history of hypertension presents today with complaints of toe injury.  He states that same occurred 1 week ago when he got up in the middle of the night and stubbed his left fifth toe.  He endorses persistent pain since then and is concerned for fracture.  He has been able to walk without difficulty.  Denies fevers or chills.  No other injuries or complaints.  The history is provided by the patient. No language interpreter was used.       Home Medications Prior to Admission medications   Medication Sig Start Date End Date Taking? Authorizing Provider  acetaminophen (TYLENOL) 500 MG tablet Take 1 tablet (500 mg total) by mouth every 6 (six) hours as needed. 04/21/22   Varney Biles, MD  acetaminophen-codeine (TYLENOL/CODEINE #3) 300-30 MG tablet Take 1 tablet by mouth every 6 (six) hours as needed. 07/15/22   Biagio Borg, MD  ALPRAZolam Duanne Moron) 1 MG tablet 1 tab by mouth three times per day as needed 09/26/22   Biagio Borg, MD  amLODipine [facility]) 5 MG tablet 1 tab by mouth once daily 02/11/22   Biagio Borg, MD  atorvastatin (LIPITOR) 80 MG tablet Take 1 tablet (80 mg total) by mouth daily. 07/15/22   Biagio Borg, MD  diphenoxylate-atropine (LOMOTIL) 2.5-0.025 MG tablet Take 1 tablet by mouth 4 (four) times daily as needed for diarrhea or loose stools. 08/09/21   Biagio Borg, MD  fluticasone (FLONASE) 50 MCG/ACT nasal spray Place 1 spray into both nostrils 2 (two) times daily. 05/10/21   [provider]  ibuprofen (ADVIL) 600 MG tablet Take 1 tablet (600 mg total) by mouth every 6 (six) hours as needed. 04/21/22   Varney Biles, MD  Multiple Vitamin (MULTIVITAMIN) tablet Take 1 tablet by mouth daily.    [provider]  ondansetron (ZOFRAN) 4 MG tablet Take 1 tablet (4 mg total) by mouth every 8 (eight) hours as needed for nausea or vomiting. 08/09/21   Biagio Borg, MD  oxyCODONE-acetaminophen (PERCOCET/ROXICET) 5-325 MG tablet Take 1 tablet by mouth every 8 (eight) hours as needed for severe pain. 09/26/22   Biagio Borg, MD  QUEtiapine (SEROQUEL) 25 MG tablet Take 1-2 tablets (25-50 mg total) by mouth at bedtime as needed (sleep). 08/14/22   Gregor Hams, MD  sildenafil (REVATIO) 20 MG tablet TAKE 3 TO 5 TABLETS BY MOUTH AS NEEDED 02/12/21   Biagio Borg, MD  topiramate (TOPAMAX) 25 MG tablet TAKE 1 TABLET(25 MG) BY MOUTH TWICE DAILY FOR HEADACHE PREVENTION 08/27/22   Gregor Hams, MD      Allergies    Penicillin g and Penicillins    Review of Systems   Review of Systems  All other systems reviewed and are negative.   Physical Exam Updated Vital Signs BP (!) 134/95   Pulse 75   Temp 98.1 F (36.7 C) (Oral)   Resp 20   Wt 110 kg   SpO2 97%   BMI 31.99 kg/m  Physical Exam Vitals and nursing note reviewed.  Constitutional:      General: He is not in acute distress.    Appearance: Normal appearance. He is  normal weight. He is not ill-appearing, toxic-appearing or diaphoretic.  HENT:     Head: Normocephalic and atraumatic.  Cardiovascular:     Rate and Rhythm: Normal rate.  Pulmonary:     Effort: Pulmonary effort is normal. No respiratory distress.  Musculoskeletal:        General: Normal range of motion.     Cervical back: Normal range of motion.     Comments: Left fifth toe with tenderness to palpation. Small linear well healing wound noted to the dorsal aspect not erythematous or warm and without purulence. No nailbed involvement. Capillary refill less than 2 seconds. Patient ambulatory with steady gait  Skin:    General: Skin is warm and dry.  Neurological:     General: No focal deficit present.     Mental Status: He is alert.  Psychiatric:        Mood and Affect:  Mood normal.        Behavior: Behavior normal.     ED Results / Procedures / Treatments   Labs (all labs ordered are listed, but only abnormal results are displayed) Labs Reviewed - No data to display  EKG None  Radiology DG Toe 5th Left  Result Date: 09/27/2022 CLINICAL DATA:  Fifth toe injury 1 week ago, persistent pain and split in the soft tissues just above the nail EXAM: DG TOE 5TH LEFT - 3V COMPARISON:  None Available. FINDINGS: No acute fracture, dislocation, or evidence of osseous erosion. No foreign body. Soft tissue injury involving the toenail of fifth toe. IMPRESSION: Soft tissue injury involving the toenail of fifth toe. No acute osseous abnormality. Electronically Signed   By: Merilyn Baba M.D.   On: 09/27/2022 12:20    Procedures Procedures    Medications Ordered in ED Medications - No data to display  ED Course/ Medical Decision Making/ A&P                             Medical Decision Making Amount and/or Complexity of Data Reviewed Radiology: ordered.   Patient presents today with complaints of left 5th toe injury 1 week ago.  He is afebrile, nontoxic-appearing, and in no acute distress with reassuring vital signs.  X-ray imaging obtained which revealed no acute osseous abnormality.  I have personally reviewed and interpreted this imaging and agree with radiology interpretation.  Physical exam did reveal a small wound to the dorsal aspect of the toe that is healing well and noninfectious appearing.  Patient given bacitracin ointment and a Band-Aid recommend buddy tape, RICE, and Tylenol/ibuprofen as needed for pain.  Evaluation and diagnostic testing in the emergency department does not suggest an emergent condition requiring admission or immediate intervention beyond what has been performed at this time.  Plan for discharge with close PCP follow-up.  Patient is understanding and amenable with plan, educated on red flag symptoms that would prompt immediate return.   Patient discharged in stable condition.  Final Clinical Impression(s) / ED Diagnoses Final diagnoses:  Injury of toe on left foot, initial encounter    Rx / DC Orders ED Discharge Orders     None     An After Visit Summary was printed and given to the patient.     Nestor Lewandowsky 09/27/22 1404    Elgie Congo, MD 09/27/22 2011

## 2022-09-28 ENCOUNTER — Encounter: Payer: Self-pay | Admitting: Internal Medicine

## 2022-09-28 NOTE — Assessment & Plan Note (Signed)
BP Readings from Last 3 Encounters:  09/27/22 (!) 134/95  09/26/22 130/76  09/12/22 126/74   Mild elevated today, pt states well controlled at home, declines further need for med change,, pt to continue medical treatment norvasc 5 qd

## 2022-09-28 NOTE — Assessment & Plan Note (Signed)
Last vitamin D Lab Results  Component Value Date   VD25OH 20.15 (L) 07/15/2022   Low, reminded to start oral replacement

## 2022-09-28 NOTE — Assessment & Plan Note (Signed)
Pt has been advised for surgury but he declines, cont pain control with bridging med, f/u pain management as planned 1 wk, new work note given with specific am and pm breaks in addition to lunch hour and routine break

## 2022-09-28 NOTE — Assessment & Plan Note (Signed)
Stable overall, decliens need for further tx or counseling referral

## 2022-10-02 ENCOUNTER — Encounter: Payer: Self-pay | Admitting: Family Medicine

## 2022-10-02 ENCOUNTER — Ambulatory Visit: Payer: Managed Care, Other (non HMO) | Admitting: Family Medicine

## 2022-10-02 VITALS — BP 122/84 | HR 79 | Ht 73.0 in

## 2022-10-02 DIAGNOSIS — S060X0D Concussion without loss of consciousness, subsequent encounter: Secondary | ICD-10-CM | POA: Diagnosis not present

## 2022-10-02 NOTE — Patient Instructions (Signed)
Thank you for coming in today.   Recheck with me as needed.   You are done!
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
## 2022-10-03 ENCOUNTER — Telehealth: Payer: Self-pay | Admitting: Physical Medicine and Rehabilitation

## 2022-10-03 NOTE — Telephone Encounter (Signed)
Patient states he is still having back pain and want to be seem by Dr. Ernestina Patches

## 2022-10-07 NOTE — Telephone Encounter (Signed)
Spoke with patient and he stated he is having the same type of back pain as before. He is having radicular pain in both legs. Last injection lasted a week or so. Please advise

## 2022-10-09 ENCOUNTER — Telehealth: Payer: Self-pay | Admitting: Physical Medicine and Rehabilitation

## 2022-10-09 NOTE — Telephone Encounter (Signed)
LVM to return call to give information below

## 2022-10-09 NOTE — Telephone Encounter (Signed)
Patient called, returning a call to Brittney to schedule with Dr. Alvester Morin.

## 2022-10-11 ENCOUNTER — Other Ambulatory Visit: Payer: Self-pay | Admitting: Physical Medicine and Rehabilitation

## 2022-10-11 DIAGNOSIS — G894 Chronic pain syndrome: Secondary | ICD-10-CM

## 2022-10-11 DIAGNOSIS — M5416 Radiculopathy, lumbar region: Secondary | ICD-10-CM

## 2022-10-11 DIAGNOSIS — G8929 Other chronic pain: Secondary | ICD-10-CM

## 2022-10-11 NOTE — Telephone Encounter (Signed)
See previous encounter

## 2022-10-11 NOTE — Telephone Encounter (Signed)
LVM to return call to give information below 

## 2022-10-11 NOTE — Telephone Encounter (Signed)
Spoke with patient and he would like to see Dr. Christell Constant. Please place referral

## 2022-10-18 ENCOUNTER — Ambulatory Visit: Payer: Managed Care, Other (non HMO) | Admitting: Orthopedic Surgery

## 2022-10-18 ENCOUNTER — Other Ambulatory Visit: Payer: Self-pay

## 2022-10-18 MED ORDER — ACETAMINOPHEN-CODEINE 300-30 MG PO TABS: 1.0000 | ORAL_TABLET | Freq: Four times a day (QID) | ORAL | 2 refills | Status: DC | PRN

## 2022-10-25 ENCOUNTER — Other Ambulatory Visit (INDEPENDENT_AMBULATORY_CARE_PROVIDER_SITE_OTHER): Payer: Managed Care, Other (non HMO)

## 2022-10-25 ENCOUNTER — Ambulatory Visit (INDEPENDENT_AMBULATORY_CARE_PROVIDER_SITE_OTHER): Payer: Managed Care, Other (non HMO) | Admitting: Orthopedic Surgery

## 2022-10-25 ENCOUNTER — Encounter: Payer: Self-pay | Admitting: Orthopedic Surgery

## 2022-10-25 VITALS — BP 145/94 | HR 72 | Ht 73.0 in | Wt 242.0 lb

## 2022-10-25 DIAGNOSIS — M5416 Radiculopathy, lumbar region: Secondary | ICD-10-CM

## 2022-10-25 DIAGNOSIS — M5442 Lumbago with sciatica, left side: Secondary | ICD-10-CM | POA: Diagnosis not present

## 2022-10-25 DIAGNOSIS — M5441 Lumbago with sciatica, right side: Secondary | ICD-10-CM

## 2022-10-25 DIAGNOSIS — G8929 Other chronic pain: Secondary | ICD-10-CM
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
## 2022-11-14 ENCOUNTER — Other Ambulatory Visit: Payer: Self-pay | Admitting: Internal Medicine

## 2022-11-14 ENCOUNTER — Ambulatory Visit
Admission: RE | Admit: 2022-11-14 | Discharge: 2022-11-14 | Disposition: A | Discharge status: Home / Self Care | Payer: Managed Care, Other (non HMO) | Source: Ambulatory Visit | Attending: Orthopedic Surgery | Admitting: Orthopedic Surgery

## 2022-11-14 DIAGNOSIS — M5416 Radiculopathy, lumbar region: Secondary | ICD-10-CM

## 2022-11-14 MED ORDER — METHYLPREDNISOLONE ACETATE 40 MG/ML INJ SUSP (RADIOLOG
80.0000 mg | Freq: Once | INTRAMUSCULAR | Status: AC
  Administered 2022-11-14: 80 mg via EPIDURAL

## 2022-11-14 MED ORDER — IOPAMIDOL (ISOVUE-M 200) INJECTION 41%
1.0000 mL | Freq: Once | INTRAMUSCULAR | Status: AC
  Administered 2022-11-14: 1 mL via EPIDURAL

## 2022-11-14 NOTE — Discharge Instructions (Signed)

## 2022-11-21 ENCOUNTER — Encounter: Payer: Self-pay | Admitting: Internal Medicine

## 2022-11-21 ENCOUNTER — Ambulatory Visit (INDEPENDENT_AMBULATORY_CARE_PROVIDER_SITE_OTHER): Payer: Managed Care, Other (non HMO) | Admitting: Internal Medicine

## 2022-11-21 VITALS — BP 142/80 | HR 100 | Temp 98.5°F | Ht 73.0 in | Wt 240.0 lb

## 2022-11-21 DIAGNOSIS — M545 Low back pain, unspecified: Secondary | ICD-10-CM

## 2022-11-21 DIAGNOSIS — I10 Essential (primary) hypertension: Secondary | ICD-10-CM | POA: Diagnosis not present

## 2022-11-21 DIAGNOSIS — G8929 Other chronic pain: Secondary | ICD-10-CM | POA: Diagnosis not present

## 2022-11-21 DIAGNOSIS — E7800 Pure hypercholesterolemia, unspecified: Secondary | ICD-10-CM

## 2022-11-21 DIAGNOSIS — E559 Vitamin D deficiency, unspecified: Secondary | ICD-10-CM

## 2022-11-21 DIAGNOSIS — Z86010 Personal history of colonic polyps: Secondary | ICD-10-CM

## 2022-11-21 MED ORDER — OXYCODONE-ACETAMINOPHEN 5-325 MG PO TABS: 1.0000 | ORAL_TABLET | Freq: Three times a day (TID) | ORAL | 0 refills | Status: DC | PRN

## 2022-11-21 NOTE — Patient Instructions (Addendum)
Please continue all other medications as before, and refills have been done if requested - the percocet  Please have the pharmacy call with any other refills you may need.  Please continue your efforts at being more active, low cholesterol diet, and weight control.  Please keep your appointments with your specialists as you may have planned  You will be contacted regarding the referral for: Pain clinic, and colonoscopy
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
## 2022-11-23 ENCOUNTER — Encounter: Payer: Self-pay | Admitting: Internal Medicine

## 2022-11-23 DIAGNOSIS — Z860100 Personal history of colon polyps, unspecified: Secondary | ICD-10-CM | POA: Insufficient documentation

## 2022-11-23 DIAGNOSIS — G8929 Other chronic pain: Secondary | ICD-10-CM | POA: Insufficient documentation

## 2022-11-23 NOTE — Assessment & Plan Note (Signed)
For colonoscopy as is due 

## 2022-11-23 NOTE — Assessment & Plan Note (Signed)
Lab Results  Component Value Date   LDLCALC 85 02/11/2022   Stable, pt to continue current statin lipitor 80 mg qd

## 2022-11-23 NOTE — Assessment & Plan Note (Signed)
BP Readings from Last 3 Encounters:  11/21/22 (!) 142/80  11/14/22 121/84  10/25/22 (!) 145/94   Uncontrolled, pt states controlled at home, pt to continue medical treatment norvasc 5 qd, declines need for change today

## 2022-11-23 NOTE — Assessment & Plan Note (Signed)
Chronic moderate to severe subjective, for bridge med percocet 5 tid , refer new pain management, but this will be last of bridging meds for 3 mo only to next pain management evaluation

## 2022-11-23 NOTE — Assessment & Plan Note (Signed)
Last vitamin D Lab Results  Component Value Date   VD25OH 20.15 (L) 07/15/2022   Low, to start oral replacement 

## 2022-11-28 ENCOUNTER — Encounter: Payer: Self-pay | Admitting: Physical Medicine & Rehabilitation

## 2022-12-02 ENCOUNTER — Encounter: Payer: Self-pay | Admitting: Internal Medicine

## 2022-12-05 ENCOUNTER — Other Ambulatory Visit (HOSPITAL_COMMUNITY): Payer: Self-pay

## 2022-12-05 ENCOUNTER — Other Ambulatory Visit (HOSPITAL_BASED_OUTPATIENT_CLINIC_OR_DEPARTMENT_OTHER): Payer: Self-pay

## 2022-12-06 ENCOUNTER — Telehealth: Payer: Self-pay | Admitting: Internal Medicine

## 2022-12-06 NOTE — Telephone Encounter (Signed)
Prescription Request  12/06/2022  LOV: 11/21/2022  What is the name of the medication or equipment? Amlodopine, alprazalam, and atorvastatin  Have you contacted your pharmacy to request a refill? Yes   Which pharmacy would you like this sent to?  [facility] - [place], [place] - [address] [place] [address] Phone: [phone] Fax: [phone]    Patient notified that their request is being sent to the clinical staff for review and that they should receive a response within 2 business days.   Please advise at Mobile [phone] (mobile)

## 2022-12-07 MED ORDER — ATORVASTATIN CALCIUM 80 MG PO TABS: 80.0000 mg | ORAL_TABLET | Freq: Every day | ORAL | 3 refills | Status: DC

## 2022-12-07 MED ORDER — ALPRAZOLAM 1 MG PO TABS: ORAL_TABLET | ORAL | 2 refills | Status: DC

## 2022-12-07 MED ORDER — AMLODIPINE BESYLATE 5 MG PO TABS: ORAL_TABLET | ORAL | 3 refills | Status: DC

## 2022-12-07 NOTE — Telephone Encounter (Signed)
Xanax done erx to [facility] as we normally dont prescribe 90 days of controlled substance medications per mail in pharmacy due to large number of pills  [facility]itor [facility]

## 2022-12-17 ENCOUNTER — Encounter: Payer: Self-pay | Admitting: Physical Medicine & Rehabilitation

## 2022-12-17 ENCOUNTER — Other Ambulatory Visit: Payer: Self-pay | Admitting: Internal Medicine

## 2022-12-17 ENCOUNTER — Encounter
Payer: Managed Care, Other (non HMO) | Attending: Physical Medicine & Rehabilitation | Admitting: Physical Medicine & Rehabilitation

## 2022-12-17 VITALS — BP 143/93 | HR 92 | Ht 73.0 in | Wt 240.0 lb

## 2022-12-17 DIAGNOSIS — G894 Chronic pain syndrome: Secondary | ICD-10-CM | POA: Insufficient documentation

## 2022-12-17 DIAGNOSIS — Z79891 Long term (current) use of opiate analgesic: Secondary | ICD-10-CM | POA: Diagnosis present

## 2022-12-17 DIAGNOSIS — Z79899 Other long term (current) drug therapy: Secondary | ICD-10-CM | POA: Insufficient documentation

## 2022-12-17 MED ORDER — OXYCODONE-ACETAMINOPHEN 5-325 MG PO TABS: 1.0000 | ORAL_TABLET | Freq: Three times a day (TID) | ORAL | 0 refills | Status: DC | PRN
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
## 2022-12-17 NOTE — Patient Instructions (Signed)
Walking Back Exercises These exercises help to make your trunk and back strong. They also help to keep the lower back flexible. Doing these exercises can help to prevent or lessen pain in your lower back. If you have back pain, try to do these exercises 2-3 times each day or as told by your doctor. As you get better, do the exercises once each day. Repeat the exercises more often as told by your doctor. To stop back pain from coming back, do the exercises once each day, or as told by your doctor. Do exercises exactly as told by your doctor. Stop right away if you feel sudden pain or your pain gets worse. Exercises Single knee to chest Do these steps 3-5 times in a row for each leg: Lie on your back on a firm bed or the floor with your legs stretched out. Bring one knee to your chest. Grab your knee or thigh with both hands and hold it in place. Pull on your knee until you feel a gentle stretch in your lower back or butt. Keep doing the stretch for 10-30 seconds. Slowly let go of your leg and straighten it. Pelvic tilt Do these steps 5-10 times in a row: Lie on your back on a firm bed or the floor with your legs stretched out. Bend your knees so they point up to the ceiling. Your feet should be flat on the floor. Tighten your lower belly (abdomen) muscles to press your lower back against the floor. This will make your tailbone point up to the ceiling instead of pointing down to your feet or the floor. Stay in this position for 5-10 seconds while you gently tighten your muscles and breathe evenly. Cat-cow Do these steps until your lower back bends more easily: Get on your hands and knees on a firm bed or the floor. Keep your hands under your shoulders, and keep your knees under your hips. You may put padding under your knees. Let your head hang down toward your chest. Tighten (contract) the muscles in your belly. Point your tailbone toward the floor so your lower back becomes rounded like the  back of a cat. Stay in this position for 5 seconds. Slowly lift your head. Let the muscles of your belly relax. Point your tailbone up toward the ceiling so your back forms a sagging arch like the back of a cow. Stay in this position for 5 seconds.  Press-ups Do these steps 5-10 times in a row: Lie on your belly (face-down) on a firm bed or the floor. Place your hands near your head, about shoulder-width apart. While you keep your back relaxed and keep your hips on the floor, slowly straighten your arms to raise the top half of your body and lift your shoulders. Do not use your back muscles. You may change where you place your hands to make yourself more comfortable. Stay in this position for 5 seconds. Keep your back relaxed. Slowly return to lying flat on the floor.  Bridges Do these steps 10 times in a row: Lie on your back on a firm bed or the floor. Bend your knees so they point up to the ceiling. Your feet should be flat on the floor. Your arms should be flat at your sides, next to your body. Tighten your butt muscles and lift your butt off the floor until your waist is almost as high as your knees. If you do not feel the muscles working in your butt and the back  of your thighs, slide your feet 1-2 inches (2.5-5 cm) farther away from your butt. Stay in this position for 3-5 seconds. Slowly lower your butt to the floor, and let your butt muscles relax. If this exercise is too easy, try doing it with your arms crossed over your chest. Belly crunches Do these steps 5-10 times in a row: Lie on your back on a firm bed or the floor with your legs stretched out. Bend your knees so they point up to the ceiling. Your feet should be flat on the floor. Cross your arms over your chest. Tip your chin a little bit toward your chest, but do not bend your neck. Tighten your belly muscles and slowly raise your chest just enough to lift your shoulder blades a tiny bit off the floor. Avoid raising your  body higher than that because it can put too much stress on your lower back. Slowly lower your chest and your head to the floor. Back lifts Do these steps 5-10 times in a row: Lie on your belly (face-down) with your arms at your sides, and rest your forehead on the floor. Tighten the muscles in your legs and your butt. Slowly lift your chest off the floor while you keep your hips on the floor. Keep the back of your head in line with the curve in your back. Look at the floor while you do this. Stay in this position for 3-5 seconds. Slowly lower your chest and your face to the floor. Contact a doctor if: Your back pain gets a lot worse when you do an exercise. Your back pain does not get better within 2 hours after you exercise. If you have any of these problems, stop doing the exercises. Do not do them again unless your doctor says it is okay. Get help right away if: You have sudden, very bad back pain. If this happens, stop doing the exercises. Do not do them again unless your doctor says it is okay. This information is not intended to replace advice given to you by your health care provider. Make sure you discuss any questions you have with your health care provider. Document Revised: 08/30/2020 Document Reviewed: 08/30/2020 Elsevier Patient Education  2024 [employer].

## 2022-12-20 LAB — TOXASSURE SELECT,+ANTIDEPR,UR

## 2022-12-31 ENCOUNTER — Encounter: Payer: Self-pay | Admitting: Gastroenterology

## 2022-12-31 ENCOUNTER — Encounter: Payer: Managed Care, Other (non HMO) | Admitting: Physical Medicine & Rehabilitation

## 2023-01-09 ENCOUNTER — Ambulatory Visit: Payer: Managed Care, Other (non HMO) | Admitting: Orthopedic Surgery

## 2023-01-09 DIAGNOSIS — G8929 Other chronic pain: Secondary | ICD-10-CM

## 2023-01-09 DIAGNOSIS — M5441 Lumbago with sciatica, right side: Secondary | ICD-10-CM

## 2023-01-09 MED ORDER — METHYLPREDNISOLONE 4 MG PO TBPK
ORAL_TABLET | ORAL | 0 refills | Status: DC
Start: 2023-01-09 — End: 2023-06-05

## 2023-01-09 NOTE — Progress Notes (Signed)
Orthopedic Spine Surgery Office Note   Assessment: Patient is a 57 y.o. male with acute exacerbation of his chronic low back pain that radiates into the right lower extremity.  MRI shows a small but noncompressive disc herniation at L1/2 and there is no significant stenosis seen in the lumbar spine     Plan: -Patient has tried pain management, physical therapy, steroid injections, pain management -Patient card about a new referral to pain management.  He did not have good experiences with [facility] or [facility], so referred him to [facility] -Prescribed a Medrol Dosepak for this acute flare of pain -Patient should return to office on an as-needed basis     Patient expressed understanding of the plan and all questions were answered to the patient's satisfaction.    ___________________________________________________________________________     History:   Patient is a 57 y.o. male who presents today for follow-up on his lumbar spine.  He states within the last couple weeks, he has had worsening of his pain.  He still feels pain in his low back rating to his right lower extremity.  He feels that along the posterior aspect of the right thigh and into the right leg.  It does not radiate into the foot.  He is not having any symptoms in the left lower extremity.  He sometimes gets numbness and paresthesias in the same distribution as the pain in his right leg.  No other numbness or paresthesias.  He has now been to 2 different pain management groups.  [facility] he felt was argumentative with him and [facility] he did not feel that he was getting good relief of his pain.  No new symptoms have developed since the last time I saw him.  He has just noticed worsening of his pain.    Treatments tried: Pain management, physical therapy, steroid injections, pain management     Physical Exam:   General: no acute distress, appears stated age Neurologic: alert, answering questions appropriately, following  commands Respiratory: unlabored breathing on room air, symmetric chest rise Psychiatric: appropriate affect, normal cadence to speech     MSK (spine):   -Strength exam                                                   Left                  Right EHL                              5/5                  5/5 TA                                 5/5                  5/5 GSC                             5/5                  5/5 Knee extension            5/5  5/5 Hip flexion                    5/5                  5/5   -Sensory exam                           Sensation intact to light touch in L3-S1 nerve distributions of bilateral lower extremities   -Achilles DTR: 1/4 on the left, 1/4 on the right -Patellar tendon DTR: 1/4 on the left, 1/4 on the right   -Straight leg raise: Negative bilaterally -Femoral nerve stretch test: Negative bilaterally -Clonus: no beats bilaterally    Imaging: XR of the lumbar spine from 10/24/2021 was previously independently reviewed and interpreted, showing lordotic alignment.  No fracture or dislocation seen.  No significant degenerative changes.  No evidence of instability on flexion/extension views.   MRI of the lumbar spine from 04/19/2022 was previously independently reviewed and interpreted, showing small noncompressive disc herniation L1/2.  No other disc herniation seen.  No significant foraminal, central, or lateral recess stenosis. Disc desiccation at L2/3.      Patient name: Timothy Fuller Patient MRN: [id number] Date of visit: 01/09/23

## 2023-01-16 ENCOUNTER — Other Ambulatory Visit: Payer: Self-pay | Admitting: Internal Medicine

## 2023-01-17 ENCOUNTER — Ambulatory Visit: Payer: Managed Care, Other (non HMO) | Admitting: Physical Medicine & Rehabilitation

## 2023-01-17 MED ORDER — OXYCODONE-ACETAMINOPHEN 5-325 MG PO TABS: 1.0000 | ORAL_TABLET | Freq: Three times a day (TID) | ORAL | 0 refills | Status: DC | PRN

## 2023-01-29 ENCOUNTER — Encounter (INDEPENDENT_AMBULATORY_CARE_PROVIDER_SITE_OTHER): Payer: Self-pay

## 2023-02-13 ENCOUNTER — Encounter: Payer: Self-pay | Admitting: Internal Medicine

## 2023-02-13 ENCOUNTER — Ambulatory Visit (INDEPENDENT_AMBULATORY_CARE_PROVIDER_SITE_OTHER): Payer: Managed Care, Other (non HMO) | Admitting: Internal Medicine

## 2023-02-13 VITALS — BP 124/78 | HR 70 | Temp 98.3°F | Ht 73.0 in | Wt 234.0 lb

## 2023-02-13 DIAGNOSIS — F419 Anxiety disorder, unspecified: Secondary | ICD-10-CM

## 2023-02-13 DIAGNOSIS — E7800 Pure hypercholesterolemia, unspecified: Secondary | ICD-10-CM

## 2023-02-13 DIAGNOSIS — G8929 Other chronic pain: Secondary | ICD-10-CM | POA: Diagnosis not present

## 2023-02-13 DIAGNOSIS — R739 Hyperglycemia, unspecified: Secondary | ICD-10-CM | POA: Diagnosis not present

## 2023-02-13 DIAGNOSIS — E559 Vitamin D deficiency, unspecified: Secondary | ICD-10-CM

## 2023-02-13 DIAGNOSIS — I10 Essential (primary) hypertension: Secondary | ICD-10-CM

## 2023-02-13 LAB — HEMOGLOBIN A1C: Hgb A1c MFr Bld: 6.4 % (ref 4.6–6.5)

## 2023-02-13 LAB — BASIC METABOLIC PANEL
BUN: 15 mg/dL (ref 6–23)
CO2: 31 mEq/L (ref 19–32)
Calcium: 9.9 mg/dL (ref 8.4–10.5)
Chloride: 99 mEq/L (ref 96–112)
Creatinine, Ser: 1.01 mg/dL (ref 0.40–1.50)
GFR: 83.88 mL/min (ref >60.00)
Glucose, Bld: 103 mg/dL — ABNORMAL HIGH (ref 70–99)
Potassium: 4.3 mEq/L (ref 3.5–5.1)
Sodium: 134 mEq/L — ABNORMAL LOW (ref 135–145)

## 2023-02-13 LAB — LIPID PANEL
Cholesterol: 129 mg/dL (ref 0–200)
HDL: 33 mg/dL — ABNORMAL LOW (ref >39.00)
LDL Cholesterol: 75 mg/dL (ref 0–99)
NonHDL: 96.38
Total CHOL/HDL Ratio: 4
Triglycerides: 107 mg/dL (ref 0.0–149.0)
VLDL: 21.4 mg/dL (ref 0.0–40.0)

## 2023-02-13 LAB — VITAMIN D 25 HYDROXY (VIT D DEFICIENCY, FRACTURES): VITD: 35.01 ng/mL (ref 30.00–100.00)

## 2023-02-13 LAB — HEPATIC FUNCTION PANEL
ALT: 22 U/L (ref 0–53)
AST: 20 U/L (ref 0–37)
Albumin: 4.3 g/dL (ref 3.5–5.2)
Alkaline Phosphatase: 60 U/L (ref 39–117)
Bilirubin, Direct: 0.1 mg/dL (ref 0.0–0.3)
Total Bilirubin: 0.5 mg/dL (ref 0.2–1.2)
Total Protein: 7.4 g/dL (ref 6.0–8.3)

## 2023-02-13 MED ORDER — ALPRAZOLAM 1 MG PO TABS: ORAL_TABLET | ORAL | 1 refills | Status: DC

## 2023-02-13 MED ORDER — OXYCODONE-ACETAMINOPHEN 5-325 MG PO TABS: 1.0000 | ORAL_TABLET | Freq: Four times a day (QID) | ORAL | 0 refills | Status: DC | PRN

## 2023-02-13 NOTE — Patient Instructions (Addendum)
You are given the work note today  Please have your Shingrix (shingles) shots done at your local pharmacy.  Ok to decrease the xanax to limit #45 per month  Ok for bridging prescription of the percocet at four times per day until your pain clinic appt sept 3  Please continue all other medications as before, and refills have been done if requested.  Please have the pharmacy call with any other refills you may need.  Please continue your efforts at being more active, low cholesterol diet, and weight control.  Please keep your appointments with your specialists as you may have planned  Please go to the LAB at the blood drawing area for the tests to be done  You will be contacted by phone if any changes need to be made immediately.  Otherwise, you will receive a letter about your results with an explanation, but please check with MyChart first.  Please remember to sign up for MyChart if you have not done so, as this will be important to you in the future with finding out test results, communicating by private email, and scheduling acute appointments online when needed.  Please make an Appointment to return in 6 months, or sooner if needed

## 2023-02-13 NOTE — Progress Notes (Signed)
The test results show that your current treatment is OK, as the tests are stable.  Please continue the same plan.  There is no other need for change of treatment or further evaluation based on these results, at this time.  thanks 
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
## 2023-02-13 NOTE — Progress Notes (Deleted)
Patient ID: Timothy Fuller, male   DOB: 1966-03-18, 57 y.o.   MRN: [id number]

## 2023-02-17 ENCOUNTER — Encounter: Payer: Self-pay | Admitting: Internal Medicine

## 2023-02-17 NOTE — Assessment & Plan Note (Signed)
BP Readings from Last 3 Encounters:  02/13/23 124/78  12/17/22 (!) 143/93  11/21/22 (!) 142/80   Stable, pt to continue medical treatment norvasc 5 every day

## 2023-02-17 NOTE — Assessment & Plan Note (Signed)
Last vitamin D Lab Results  Component Value Date   VD25OH 35.01 02/13/2023   Low, to start oral replacement

## 2023-02-17 NOTE — Assessment & Plan Note (Signed)
Lab Results  Component Value Date   LDLCALC 75 02/13/2023   Stable, pt to continue current statin lipitor 80 qd

## 2023-02-17 NOTE — Assessment & Plan Note (Signed)
Improved, for decreased xanax 1 mg limit #45 /month

## 2023-02-17 NOTE — Assessment & Plan Note (Signed)
Ok for limited bridging percocet prn, f/u pain management sept 3 as planned

## 2023-02-26 ENCOUNTER — Other Ambulatory Visit: Payer: Self-pay | Admitting: Internal Medicine

## 2023-02-26 MED ORDER — OXYCODONE-ACETAMINOPHEN 5-325 MG PO TABS: 1.0000 | ORAL_TABLET | Freq: Four times a day (QID) | ORAL | 0 refills | Status: DC | PRN

## 2023-03-06 ENCOUNTER — Other Ambulatory Visit: Payer: Self-pay | Admitting: Internal Medicine

## 2023-03-07 ENCOUNTER — Other Ambulatory Visit: Payer: Self-pay

## 2023-03-18 ENCOUNTER — Ambulatory Visit (AMBULATORY_SURGERY_CENTER): Payer: Managed Care, Other (non HMO) | Admitting: *Deleted

## 2023-03-18 VITALS — Ht 73.0 in | Wt 240.0 lb

## 2023-03-18 DIAGNOSIS — Z86010 Personal history of colonic polyps: Secondary | ICD-10-CM

## 2023-03-18 MED ORDER — PEG 3350-KCL-NA BICARB-NACL 420 G PO SOLR
4000.0000 mL | Freq: Once | ORAL | 0 refills | Status: AC
Start: 2023-03-18 — End: 2023-03-18

## 2023-03-18 NOTE — Progress Notes (Signed)
Pt's name and DOB verified at the beginning of the pre-visit.  Pt denies any difficulty with ambulating,sitting, laying down or rolling side to side Gave both [facility] main # and MD on call # prior to instructions.  No egg or soy allergy known to patient  No issues known to pt with past sedation with any surgeries or procedures Patient denies ever being intubated Pt has  no  issues moving head neck or swallowing No FH of Malignant Hyperthermia Pt is not on diet pills Pt is not on home 02  Pt is not on blood thinners  Pt denies issues with constipation  Pt is not on dialysis Pt denise any abnormal heart rhythms  Pt denies any upcoming cardiac testing Pt encouraged to use to use Singlecare or Goodrx to reduce cost  Patient's chart reviewed by Cathlyn Parsons CNRA prior to pre-visit and patient appropriate for the [facility].  Pre-visit completed and red dot placed by patient's name on their procedure day (on provider's schedule).  . Visit by phone Pt states weight is 240lb Instructed pt why it is important to and  to call if they have any changes in health or new medications. Directed them to the # given and on instructions.   Pt states they will.  Instructions reviewed with pt and pt states understanding. Instructed to review again prior to procedure. Pt states they will.  Instructions sent by mail with coupon and by my chart   `

## 2023-03-19 ENCOUNTER — Encounter: Payer: Self-pay | Admitting: Gastroenterology

## 2023-04-01 ENCOUNTER — Encounter: Payer: Self-pay | Admitting: Gastroenterology

## 2023-04-01 ENCOUNTER — Ambulatory Visit: Payer: Managed Care, Other (non HMO) | Admitting: Gastroenterology

## 2023-04-01 VITALS — BP 109/70 | HR 64 | Temp 98.0°F | Resp 10 | Ht 73.0 in | Wt 240.0 lb

## 2023-04-01 DIAGNOSIS — Z860100 Personal history of colon polyps, unspecified: Secondary | ICD-10-CM

## 2023-04-01 DIAGNOSIS — Z09 Encounter for follow-up examination after completed treatment for conditions other than malignant neoplasm: Secondary | ICD-10-CM

## 2023-04-01 DIAGNOSIS — D122 Benign neoplasm of ascending colon: Secondary | ICD-10-CM | POA: Diagnosis not present

## 2023-04-01 DIAGNOSIS — Z860101 Personal history of adenomatous and serrated colon polyps: Secondary | ICD-10-CM

## 2023-04-01 DIAGNOSIS — K635 Polyp of colon: Secondary | ICD-10-CM

## 2023-04-01 DIAGNOSIS — D127 Benign neoplasm of rectosigmoid junction: Secondary | ICD-10-CM

## 2023-04-01 DIAGNOSIS — D123 Benign neoplasm of transverse colon: Secondary | ICD-10-CM

## 2023-04-01 MED ORDER — HYDROCORTISONE ACETATE 25 MG RE SUPP: 25.0000 mg | Freq: Every evening | RECTAL | 1 refills | Status: DC

## 2023-04-01 MED ORDER — SODIUM CHLORIDE 0.9 % IV SOLN
500.0000 mL | Freq: Once | INTRAVENOUS | Status: DC
Start: 2023-04-01 — End: 2023-04-01

## 2023-04-01 NOTE — Op Note (Signed)
[facility] Patient Name: Timothy Fuller Procedure Date: 04/01/2023 9:01 AM MRN: [id number] Endoscopist: Corliss Parish , MD, [id number] Age: 57 Referring MD:  Date of Birth: 10/04/1965 Gender: Male Account #: [id number] Procedure:                Colonoscopy Indications:              Surveillance: Personal history of adenomatous                            polyps on last colonoscopy 5 years ago Medicines:                Monitored Anesthesia Care Procedure:                Pre-Anesthesia Assessment:                           - Prior to the procedure, a History and Physical                            was performed, and patient medications and                            allergies were reviewed. The patient's tolerance of                            previous anesthesia was also reviewed. The risks                            and benefits of the procedure and the sedation                            options and risks were discussed with the patient.                            All questions were answered, and informed consent                            was obtained. Prior Anticoagulants: The patient has                            taken no anticoagulant or antiplatelet agents. ASA                            Grade Assessment: II - A patient with mild systemic                            disease. After reviewing the risks and benefits,                            the patient was deemed in satisfactory condition to                            undergo the procedure.  After obtaining informed consent, the colonoscope                            was passed under direct vision. Throughout the                            procedure, the patient's blood pressure, pulse, and                            oxygen saturations were monitored continuously. The                            CF [id number] #[id number] was introduced through the anus                            and advanced to the 3  cm into the ileum. The                            colonoscopy was performed without difficulty. The                            patient tolerated the procedure. The quality of the                            bowel preparation was adequate. The terminal ileum,                            ileocecal valve, appendiceal orifice, and rectum                            were photographed. Scope In: 9:08:12 AM Scope Out: 9:25:32 AM Scope Withdrawal Time: 0 hours 12 minutes 57 seconds  Total Procedure Duration: 0 hours 17 minutes 20 seconds  Findings:                 The digital rectal exam findings include                            hemorrhoids. Pertinent negatives include no                            palpable rectal lesions.                           A large amount of semi-liquid stool was found in                            the entire colon, interfering with visualization.                            Lavage of the area was performed using copious                            amounts, resulting in clearance with adequate  visualization.                           The terminal ileum and ileocecal valve appeared                            normal.                           Two sessile polyps were found in the recto-sigmoid                            colon and transverse colon. The polyps were 3 to 5                            mm in size. These polyps were removed with a cold                            snare. Resection and retrieval were complete.                           Multiple small-mouthed diverticula were found in                            the recto-sigmoid colon, sigmoid colon and                            descending colon.                           Normal mucosa was found in the entire colon                            otherwise.                           Non-bleeding non-thrombosed internal hemorrhoids                            were found during retroflexion, during  perianal                            exam and during digital exam. The hemorrhoids were                            Grade II (internal hemorrhoids that prolapse but                            reduce spontaneously). Complications:            No immediate complications. Estimated Blood Loss:     Estimated blood loss was minimal. Impression:               - Hemorrhoids found on digital rectal exam.                           - Stool in the  entire examined colon - lavaged with                            adequate visualization.                           - The examined portion of the ileum was normal.                           - Two 4 to 5 mm polyps at the recto-sigmoid colon                            and in the transverse colon, removed with a cold                            snare. Resected and retrieved.                           - Diverticulosis in the recto-sigmoid colon, in the                            sigmoid colon and in the descending colon.                           - Normal mucosa in the entire examined colon                            otherwise.                           - Non-bleeding non-thrombosed internal hemorrhoids. Recommendation:           - The patient will be observed post-procedure,                            until all discharge criteria are met.                           - Discharge patient to home.                           - Patient has a contact number available for                            emergencies. The signs and symptoms of potential                            delayed complications were discussed with the                            patient. Return to normal activities tomorrow.                            Written discharge instructions were provided to the  patient.                           - High fiber diet.                           - Use FiberCon 1-2 tablets PO daily.                           - Continue present medications.                            - Await pathology results.                           - Repeat colonoscopy in 5-7 years for surveillance                            based on pathology results.                           - We discussed with patient further workup and                            potential management of internal hemorrhoids                            including hemorrhoidal banding protocol with one of                            my partners.                           - The findings and recommendations were discussed                            with the patient.                           - The findings and recommendations were discussed                            with the patient's family. Corliss Parish, MD 04/01/2023 9:31:24 AM

## 2023-04-01 NOTE — Progress Notes (Unsigned)
Uneventful anesthetic. Report to pacu rn. Vss. Care resumed by rn. 

## 2023-04-01 NOTE — Progress Notes (Unsigned)
Called to room to assist during endoscopic procedure.  Patient ID and intended procedure confirmed with present staff. Received instructions for my participation in the procedure from the performing physician.  
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
## 2023-04-01 NOTE — Progress Notes (Unsigned)
Pt's states no medical or surgical changes since previsit or office visit.  PT took all of his Golytely prep last night at 8 pm. He reports that he has yellow/clear liquid results.

## 2023-04-01 NOTE — Patient Instructions (Signed)
Handouts Provided:  Diverticulosis, High Fiber Diet and Hemorrhoidal banding.  Use FiberCon 1-2 tablets by mouth daily.   YOU HAD AN ENDOSCOPIC PROCEDURE TODAY AT THE  ENDOSCOPY CENTER:   Refer to the procedure report that was given to you for any specific questions about what was found during the examination.  If the procedure report does not answer your questions, please call your gastroenterologist to clarify.  If you requested that your care partner not be given the details of your procedure findings, then the procedure report has been included in a sealed envelope for you to review at your convenience later.  YOU SHOULD EXPECT: Some feelings of bloating in the abdomen. Passage of more gas than usual.  Walking can help get rid of the air that was put into your GI tract during the procedure and reduce the bloating. If you had a lower endoscopy (such as a colonoscopy or flexible sigmoidoscopy) you may notice spotting of blood in your stool or on the toilet paper. If you underwent a bowel prep for your procedure, you may not have a normal bowel movement for a few days.  Please Note:  You might notice some irritation and congestion in your nose or some drainage.  This is from the oxygen used during your procedure.  There is no need for concern and it should clear up in a day or so.  SYMPTOMS TO REPORT IMMEDIATELY:  Following lower endoscopy (colonoscopy or flexible sigmoidoscopy):  Excessive amounts of blood in the stool  Significant tenderness or worsening of abdominal pains  Swelling of the abdomen that is new, acute  Fever of 100F or higher  For urgent or emergent issues, a gastroenterologist can be reached at any hour by calling [phone]. Do not use MyChart messaging for urgent concerns.    DIET:  We do recommend a small meal at first, but then you may proceed to your regular diet.  Drink plenty of fluids but you should avoid alcoholic beverages for 24 hours.  ACTIVITY:  You  should plan to take it easy for the rest of today and you should NOT DRIVE or use heavy machinery until tomorrow (because of the sedation medicines used during the test).    FOLLOW UP: Our staff will call the number listed on your records the next business day following your procedure.  We will call around 7:15- 8:00 am to check on you and address any questions or concerns that you may have regarding the information given to you following your procedure. If we do not reach you, we will leave a message.     If any biopsies were taken you will be contacted by phone or by letter within the next 1-3 weeks.  Please call us at [phone] if you have not heard about the biopsies in 3 weeks.    SIGNATURES/CONFIDENTIALITY: You and/or your care partner have signed paperwork which will be entered into your electronic medical record.  These signatures attest to the fact that that the information above on your After Visit Summary has been reviewed and is understood.  Full responsibility of the confidentiality of this discharge information lies with you and/or your care-partner.

## 2023-04-02 ENCOUNTER — Telehealth: Payer: Self-pay | Admitting: *Deleted

## 2023-04-02 NOTE — Telephone Encounter (Signed)
Attempted f/u phone call. No answer. Left message. °

## 2023-04-03 LAB — SURGICAL PATHOLOGY

## 2023-04-04 ENCOUNTER — Encounter: Payer: Self-pay | Admitting: Gastroenterology

## 2023-04-10 ENCOUNTER — Ambulatory Visit (INDEPENDENT_AMBULATORY_CARE_PROVIDER_SITE_OTHER): Payer: Managed Care, Other (non HMO) | Admitting: Internal Medicine

## 2023-04-10 ENCOUNTER — Encounter: Payer: Self-pay | Admitting: Internal Medicine

## 2023-04-10 VITALS — BP 136/84 | HR 75 | Temp 99.0°F | Ht 73.0 in | Wt 240.0 lb

## 2023-04-10 DIAGNOSIS — E559 Vitamin D deficiency, unspecified: Secondary | ICD-10-CM

## 2023-04-10 DIAGNOSIS — F419 Anxiety disorder, unspecified: Secondary | ICD-10-CM

## 2023-04-10 DIAGNOSIS — E7800 Pure hypercholesterolemia, unspecified: Secondary | ICD-10-CM

## 2023-04-10 DIAGNOSIS — F5101 Primary insomnia: Secondary | ICD-10-CM

## 2023-04-10 DIAGNOSIS — I10 Essential (primary) hypertension: Secondary | ICD-10-CM

## 2023-04-10 MED ORDER — TRAZODONE HCL 100 MG PO TABS: 100.0000 mg | ORAL_TABLET | Freq: Every evening | ORAL | 1 refills | Status: AC | PRN

## 2023-04-10 MED ORDER — ALPRAZOLAM 1 MG PO TABS: ORAL_TABLET | ORAL | 2 refills | Status: DC
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
## 2023-04-10 NOTE — Patient Instructions (Addendum)
Ok for the work notes today  Please take all new medication as prescribed - the trazodone 100 mg at bedtime as needed  Please continue all other medications as before, and refills have been done if requested - the xanax#60  Please have the pharmacy call with any other refills you may need.  Please continue your efforts at being more active, low cholesterol diet, and weight control.  Please keep your appointments with your specialists as you may have planned  Please make an Appointment to return in Aug 20 2023, or sooner if needed

## 2023-04-12 ENCOUNTER — Encounter: Payer: Self-pay | Admitting: Internal Medicine

## 2023-04-12 DIAGNOSIS — G4700 Insomnia, unspecified: Secondary | ICD-10-CM | POA: Insufficient documentation

## 2023-04-12 NOTE — Assessment & Plan Note (Signed)
BP Readings from Last 3 Encounters:  04/10/23 136/84  04/01/23 109/70  02/13/23 124/78   Stable, pt to continue medical treatment norvasc 5 every day,

## 2023-04-12 NOTE — Assessment & Plan Note (Signed)
Last vitamin D Lab Results  Component Value Date   VD25OH 35.01 02/13/2023   Low, to start oral replacement

## 2023-04-12 NOTE — Assessment & Plan Note (Signed)
Mild to mod, for limited xanax prn, declines need for counseling or psychiatry for now,  to f/u any worsening symptoms or concerns

## 2023-04-12 NOTE — Assessment & Plan Note (Signed)
Lab Results  Component Value Date   LDLCALC 75 02/13/2023   Stable, pt to continue current statin lipitor 80 mg qd

## 2023-04-12 NOTE — Assessment & Plan Note (Signed)
Mild to mod, for trazodone 100 at bedtime prn,  to f/u any worsening symptoms or concerns

## 2023-06-05 ENCOUNTER — Encounter: Payer: Self-pay | Admitting: Family Medicine

## 2023-06-05 ENCOUNTER — Ambulatory Visit: Payer: Managed Care, Other (non HMO) | Admitting: Family Medicine

## 2023-06-05 VITALS — BP 132/86 | HR 75 | Temp 97.6°F | Ht 73.0 in | Wt 241.0 lb

## 2023-06-05 DIAGNOSIS — R1084 Generalized abdominal pain: Secondary | ICD-10-CM

## 2023-06-05 DIAGNOSIS — R112 Nausea with vomiting, unspecified: Secondary | ICD-10-CM

## 2023-06-05 DIAGNOSIS — I10 Essential (primary) hypertension: Secondary | ICD-10-CM

## 2023-06-05 DIAGNOSIS — K529 Noninfective gastroenteritis and colitis, unspecified: Secondary | ICD-10-CM | POA: Diagnosis not present

## 2023-06-05 LAB — CBC WITH DIFFERENTIAL/PLATELET
Basophils Absolute: 0 10*3/uL (ref 0.0–0.1)
Basophils Relative: 0.4 % (ref 0.0–3.0)
Eosinophils Absolute: 0.1 10*3/uL (ref 0.0–0.7)
Eosinophils Relative: 2.3 % (ref 0.0–5.0)
HCT: 46.5 % (ref 39.0–52.0)
Hemoglobin: 15.6 g/dL (ref 13.0–17.0)
Lymphocytes Relative: 53 % — ABNORMAL HIGH (ref 12.0–46.0)
Lymphs Abs: 3.4 10*3/uL (ref 0.7–4.0)
MCHC: 33.6 g/dL (ref 30.0–36.0)
MCV: 81.9 fL (ref 78.0–100.0)
Monocytes Absolute: 0.6 10*3/uL (ref 0.1–1.0)
Monocytes Relative: 8.8 % (ref 3.0–12.0)
Neutro Abs: 2.3 10*3/uL (ref 1.4–7.7)
Neutrophils Relative %: 35.5 % — ABNORMAL LOW (ref 43.0–77.0)
Platelets: 329 10*3/uL (ref 150.0–400.0)
RBC: 5.67 Mil/uL (ref 4.22–5.81)
RDW: 14.5 % (ref 11.5–15.5)
WBC: 6.4 10*3/uL (ref 4.0–10.5)

## 2023-06-05 LAB — COMPREHENSIVE METABOLIC PANEL
ALT: 23 U/L (ref 0–53)
AST: 17 U/L (ref 0–37)
Albumin: 4.3 g/dL (ref 3.5–5.2)
Alkaline Phosphatase: 61 U/L (ref 39–117)
BUN: 11 mg/dL (ref 6–23)
CO2: 31 meq/L (ref 19–32)
Calcium: 9.7 mg/dL (ref 8.4–10.5)
Chloride: 104 meq/L (ref 96–112)
Creatinine, Ser: 0.91 mg/dL (ref 0.40–1.50)
GFR: 94.85 mL/min (ref >60.00)
Glucose, Bld: 74 mg/dL (ref 70–99)
Potassium: 4.1 meq/L (ref 3.5–5.1)
Sodium: 140 meq/L (ref 135–145)
Total Bilirubin: 0.4 mg/dL (ref 0.2–1.2)
Total Protein: 7.5 g/dL (ref 6.0–8.3)

## 2023-06-05 LAB — LIPASE: Lipase: 41 U/L (ref 11.0–59.0)

## 2023-06-05 MED ORDER — ONDANSETRON 4 MG PO TBDP
4.0000 mg | ORAL_TABLET | Freq: Three times a day (TID) | ORAL | 0 refills | Status: DC | PRN
Start: 2023-06-05 — End: 2023-11-12
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
## 2023-06-05 NOTE — Patient Instructions (Addendum)
Please go downstairs for labs before you leave.  You may take Imodium over-the-counter to help slow down your diarrhea as long as you do not have fever or bloody stools.  Take the Zofran (dissolves under your tongue) for nausea. Try to increase fluids, clear liquids at first and avoid diary and acidic beverages.   Increase to bland food as tolerated. Broths, chicken soup, crackers, rice, etc.   Avoid greasy, fried, or spicy foods.   If you are unable to pee, develop severe abdominal pain, fever, bloody stools, dizziness, or any other worsening symptoms then you need to be seen again.  If your diarrhea lasts for more than 1 week, you should be seen again.

## 2023-06-16 ENCOUNTER — Telehealth: Payer: Self-pay | Admitting: Internal Medicine

## 2023-06-16 ENCOUNTER — Other Ambulatory Visit: Payer: Self-pay

## 2023-06-16 MED ORDER — AMLODIPINE BESYLATE 5 MG PO TABS: ORAL_TABLET | ORAL | 3 refills | Status: DC

## 2023-06-16 NOTE — Telephone Encounter (Signed)
Patient needs his blood pressure medication sent back in.  His amlodopine.  He thinks he may have accidentally thrown it away.  Please send to [facility] on [address] in [place]  Please call patient and let him know when this has been done.  [phone]

## 2023-06-16 NOTE — Telephone Encounter (Signed)
Called and let Pt know refill has been sent.

## 2023-06-19 ENCOUNTER — Ambulatory Visit: Payer: Self-pay | Admitting: Internal Medicine

## 2023-06-19 NOTE — Telephone Encounter (Signed)
Chief Complaint: Diarrhea Symptoms: Loose stools Frequency: Siince yesterday Pertinent Negatives: Patient denies abd pain, N/V Disposition: [] ED /[] Urgent Care (no appt availability in office) / [] Appointment(In office/virtual)/ [x]  [facility]/ [] Home Care/ [] Refused Recommended Disposition /[] [place] [facility]/ []  Follow-up with PCP Additional Notes: Pt reports he has had about 14 episodes of diarrhea today, he describes them as loose watery stools. Denies abd pain, N/V, fever. Pt reports aside from dry mouth he feels hydrated and has been drinking water, Powerade and Ginger Ale. Pt reports seeing some bright red blood in stools but believes this is from hemorrhoids. Recommended UC this evening but pt declines due to being unable to pay higher copay and requests appt. Virtual visit scheduled for 12/20 at 1740. Home care discussed, pt verbalized understanding and agrees to plan.   Copied from CRM [id number]. Topic: Clinical - Red Word Triage >> Jun 19, 2023  4:39 PM Fuller Mandril wrote: Red Word that prompted transfer to Nurse Triage: diarrhea loose watery stool Reason for Disposition  [1] SEVERE diarrhea (e.g., 7 or more times / day more than normal) AND [2] present > 24 hours (1 day)  Answer Assessment - Initial Assessment Questions 1. DIARRHEA SEVERITY: "How bad is the diarrhea?" "How many more stools have you had in the past 24 hours than normal?"    - NO DIARRHEA (SCALE 0)   - MILD (SCALE 1-3): Few loose or mushy BMs; increase of 1-3 stools over normal daily number of stools; mild increase in ostomy output.   -  MODERATE (SCALE 4-7): Increase of 4-6 stools daily over normal; moderate increase in ostomy output.   -  SEVERE (SCALE 8-10; OR "WORST POSSIBLE"): Increase of 7 or more stools daily over normal; moderate increase in ostomy output; incontinence.     Severe 15 times today 2. ONSET: "When did the diarrhea begin?"      Started yesterday 3. BM CONSISTENCY: "How loose or  watery is the diarrhea?"      Watery 4. VOMITING: "Are you also vomiting?" If Yes, ask: "How many times in the past 24 hours?"      None 5. ABDOMEN PAIN: "Are you having any abdomen pain?" If Yes, ask: "What does it feel like?" (e.g., crampy, dull, intermittent, constant)      None 6. ABDOMEN PAIN SEVERITY: If present, ask: "How bad is the pain?"  (e.g., Scale 1-10; mild, moderate, or severe)   - MILD (1-3): doesn't interfere with normal activities, abdomen soft and not tender to touch    - MODERATE (4-7): interferes with normal activities or awakens from sleep, abdomen tender to touch    - SEVERE (8-10): excruciating pain, doubled over, unable to do any normal activities       None 7. ORAL INTAKE: If vomiting, "Have you been able to drink liquids?" "How much liquids have you had in the past 24 hours?"     Drinking water, Powerade, Ginger Ale "everything is going right through me" 8. HYDRATION: "Any signs of dehydration?" (e.g., dry mouth [not just dry lips], too weak to stand, dizziness, new weight loss) "When did you last urinate?"     Dry mouth 9. EXPOSURE: "Have you traveled to a foreign country recently?" "Have you been exposed to anyone with diarrhea?" "Could you have eaten any food that was spoiled?"     None 10. ANTIBIOTIC USE: "Are you taking antibiotics now or have you taken antibiotics in the past 2 months?"       No  11. OTHER SYMPTOMS: "Do you have any other symptoms?" (e.g., fever, blood in stool)       Blood, bright red he believes from his hemorrhoids, sometimes dark red  Protocols used: Diarrhea-A-AH

## 2023-06-20 ENCOUNTER — Telehealth (INDEPENDENT_AMBULATORY_CARE_PROVIDER_SITE_OTHER): Payer: Managed Care, Other (non HMO) | Admitting: Nurse Practitioner

## 2023-06-20 ENCOUNTER — Encounter: Payer: Self-pay | Admitting: Nurse Practitioner

## 2023-06-20 DIAGNOSIS — K529 Noninfective gastroenteritis and colitis, unspecified: Secondary | ICD-10-CM | POA: Insufficient documentation

## 2023-06-20 NOTE — Assessment & Plan Note (Signed)
Acute, likely viral gastroenteritis. Symptoms have started improving over the last 24 hours as far as stool becoming more solidified.  Still having frequent stools, but in absence of fever, nausea, vomiting think it is reasonable for patient to be able to use over-the-counter Pepto-Bismol or Imodium.  Likely the small amount of bright red blood is from hemorrhoids.  Patient was told if symptoms do not improve in any way by tomorrow or worsen he should be seen urgently in person at urgent care.  He was also told if symptoms persist through Monday he can come to be seen for in person evaluation on Monday.  If this were to occur would recommend testing for C. difficile.  For now no antibiotic treatment recommended, but patient was encouraged to use Pepto-Bismol or Imodium as needed for his diarrhea as well as to hydrate well with water/Gatorade/Pedialyte and continue to eat bland foods as long as his body tolerates this.  He reports his understanding.  He is requesting work excuse letter, I am agreeable to this.  Will provide to him via MyChart.

## 2023-06-20 NOTE — Progress Notes (Signed)
Established Patient Office Visit  An audio/visual tele-health visit was completed today for this patient. I connected with  Gari Tubridy on 06/20/23 utilizing audio/visual technology and verified that I am speaking with the correct person using two identifiers. The patient was located at their home, and I was located at home during the encounter. I discussed the limitations of evaluation and management by telemedicine. The patient expressed understanding and agreed to proceed.     Subjective   Patient ID: Timothy Fuller, male    DOB: 27-Nov-1965  Age: 57 y.o. MRN: [id number]  Chief Complaint  Patient presents with   Diarrhea    It been going on for about 2 days, watery. There is some blood     Patient has today for virtual visit for the above.  He reports symptoms started 2 days ago around the time he was leaving work.  He felt like he was going to pass gas but then could tell he was going to have a bowel movement.  Since then he has been having frequent watery bowel movements.  Reports that today movements are a bit more solid than they were yesterday.  He has had about 10 bowel movements in the last 24 hours.  No nausea, vomiting, or abdominal pain.  No fever or chills.  He is able to drink fluids and keep food down.  He does report history of hemorrhoids and has seen small amounts of bright red blood in his stool.  Reports that his granddaughter had similar symptoms recently and she has since improved.  Did eat at a restaurant prior to symptoms starting, but this is a restaurant he has been to many times previously without issue.  Denies any foul smelling odor.    Review of Systems  Constitutional:  Negative for chills and fever.  Gastrointestinal:  Positive for blood in stool and diarrhea. Negative for abdominal pain, nausea and vomiting.      Objective:     There were no vitals taken for this visit.   Physical Exam Comprehensive physical exam not completed today as office visit was  conducted remotely.  Patient appears generally well over video, no signs of acute distress noted.  Patient was alert and oriented, and appeared to have appropriate judgment.   No results found for any visits on 06/20/23.    The ASCVD Risk score (Arnett DK, et al., 2019) failed to calculate for the following reasons:   The valid total cholesterol range is 130 to 320 mg/dL    Assessment & Plan:   Problem List Items Addressed This Visit       Digestive   Gastroenteritis - Primary   Acute, likely viral gastroenteritis. Symptoms have started improving over the last 24 hours as far as stool becoming more solidified.  Still having frequent stools, but in absence of fever, nausea, vomiting think it is reasonable for patient to be able to use over-the-counter Pepto-Bismol or Imodium.  Likely the small amount of bright red blood is from hemorrhoids.  Patient was told if symptoms do not improve in any way by tomorrow or worsen he should be seen urgently in person at urgent care.  He was also told if symptoms persist through Monday he can come to be seen for in person evaluation on Monday.  If this were to occur would recommend testing for C. difficile.  For now no antibiotic treatment recommended, but patient was encouraged to use Pepto-Bismol or Imodium as needed for his diarrhea as well as  to hydrate well with water/Gatorade/Pedialyte and continue to eat bland foods as long as his body tolerates this.  He reports his understanding.  He is requesting work excuse letter, I am agreeable to this.  Will provide to him via MyChart.       Return if symptoms worsen or fail to improve.    Elenore Paddy, NP

## 2023-07-22 ENCOUNTER — Encounter: Payer: Self-pay | Admitting: Gastroenterology

## 2023-07-24 ENCOUNTER — Encounter: Payer: Self-pay | Admitting: Gastroenterology

## 2023-08-13 ENCOUNTER — Other Ambulatory Visit: Payer: Self-pay | Admitting: Family Medicine

## 2023-08-13 ENCOUNTER — Ambulatory Visit: Payer: Self-pay

## 2023-08-13 DIAGNOSIS — M25571 Pain in right ankle and joints of right foot: Secondary | ICD-10-CM

## 2023-08-18 ENCOUNTER — Other Ambulatory Visit: Payer: Self-pay | Admitting: Internal Medicine

## 2023-08-20 ENCOUNTER — Ambulatory Visit: Payer: Managed Care, Other (non HMO) | Admitting: Internal Medicine

## 2023-08-27 ENCOUNTER — Ambulatory Visit: Payer: Managed Care, Other (non HMO) | Admitting: Internal Medicine

## 2023-09-18 ENCOUNTER — Ambulatory Visit: Admitting: Internal Medicine

## 2023-09-19 ENCOUNTER — Ambulatory Visit: Admitting: Internal Medicine

## 2023-09-19 ENCOUNTER — Encounter: Payer: Self-pay | Admitting: Internal Medicine

## 2023-09-19 VITALS — BP 128/72 | HR 71 | Temp 98.0°F | Ht 73.0 in | Wt 234.0 lb

## 2023-09-19 DIAGNOSIS — Z0000 Encounter for general adult medical examination without abnormal findings: Secondary | ICD-10-CM

## 2023-09-19 DIAGNOSIS — E7800 Pure hypercholesterolemia, unspecified: Secondary | ICD-10-CM

## 2023-09-19 DIAGNOSIS — E538 Deficiency of other specified B group vitamins: Secondary | ICD-10-CM

## 2023-09-19 DIAGNOSIS — F419 Anxiety disorder, unspecified: Secondary | ICD-10-CM

## 2023-09-19 DIAGNOSIS — R739 Hyperglycemia, unspecified: Secondary | ICD-10-CM | POA: Diagnosis not present

## 2023-09-19 DIAGNOSIS — E559 Vitamin D deficiency, unspecified: Secondary | ICD-10-CM | POA: Diagnosis not present

## 2023-09-19 DIAGNOSIS — Z125 Encounter for screening for malignant neoplasm of prostate: Secondary | ICD-10-CM | POA: Diagnosis not present

## 2023-09-19 DIAGNOSIS — R350 Frequency of micturition: Secondary | ICD-10-CM | POA: Diagnosis not present

## 2023-09-19 DIAGNOSIS — Z0001 Encounter for general adult medical examination with abnormal findings: Secondary | ICD-10-CM

## 2023-09-19 LAB — CBC WITH DIFFERENTIAL/PLATELET
Basophils Absolute: 0 10*3/uL (ref 0.0–0.1)
Basophils Relative: 0.5 % (ref 0.0–3.0)
Eosinophils Absolute: 0.2 10*3/uL (ref 0.0–0.7)
Eosinophils Relative: 2.8 % (ref 0.0–5.0)
HCT: 43.4 % (ref 39.0–52.0)
Hemoglobin: 14.1 g/dL (ref 13.0–17.0)
Lymphocytes Relative: 54.4 % — ABNORMAL HIGH (ref 12.0–46.0)
Lymphs Abs: 3.7 10*3/uL (ref 0.7–4.0)
MCHC: 32.6 g/dL (ref 30.0–36.0)
MCV: 81.8 fl (ref 78.0–100.0)
Monocytes Absolute: 0.5 10*3/uL (ref 0.1–1.0)
Monocytes Relative: 7.5 % (ref 3.0–12.0)
Neutro Abs: 2.4 10*3/uL (ref 1.4–7.7)
Neutrophils Relative %: 34.8 % — ABNORMAL LOW (ref 43.0–77.0)
Platelets: 311 10*3/uL (ref 150.0–400.0)
RBC: 5.31 Mil/uL (ref 4.22–5.81)
RDW: 15.1 % (ref 11.5–15.5)
WBC: 6.8 10*3/uL (ref 4.0–10.5)

## 2023-09-19 LAB — URINALYSIS, ROUTINE W REFLEX MICROSCOPIC
Bilirubin Urine: NEGATIVE
Ketones, ur: NEGATIVE
Leukocytes,Ua: NEGATIVE
Nitrite: NEGATIVE
Specific Gravity, Urine: 1.015 (ref 1.000–1.030)
Total Protein, Urine: NEGATIVE
Urine Glucose: NEGATIVE
Urobilinogen, UA: 0.2 (ref 0.0–1.0)
pH: 7 (ref 5.0–8.0)

## 2023-09-19 LAB — HEPATIC FUNCTION PANEL
ALT: 21 U/L (ref 0–53)
AST: 21 U/L (ref 0–37)
Albumin: 4.3 g/dL (ref 3.5–5.2)
Alkaline Phosphatase: 61 U/L (ref 39–117)
Bilirubin, Direct: 0.1 mg/dL (ref 0.0–0.3)
Total Bilirubin: 0.4 mg/dL (ref 0.2–1.2)
Total Protein: 6.9 g/dL (ref 6.0–8.3)

## 2023-09-19 LAB — BASIC METABOLIC PANEL
BUN: 18 mg/dL (ref 6–23)
CO2: 33 meq/L — ABNORMAL HIGH (ref 19–32)
Calcium: 9.5 mg/dL (ref 8.4–10.5)
Chloride: 102 meq/L (ref 96–112)
Creatinine, Ser: 1.21 mg/dL (ref 0.40–1.50)
GFR: 67.25 mL/min (ref >60.00)
Glucose, Bld: 79 mg/dL (ref 70–99)
Potassium: 4.3 meq/L (ref 3.5–5.1)
Sodium: 140 meq/L (ref 135–145)

## 2023-09-19 LAB — LIPID PANEL
Cholesterol: 143 mg/dL (ref 0–200)
HDL: 29 mg/dL — ABNORMAL LOW (ref >39.00)
LDL Cholesterol: 51 mg/dL (ref 0–99)
NonHDL: 113.71
Total CHOL/HDL Ratio: 5
Triglycerides: 315 mg/dL — ABNORMAL HIGH (ref 0.0–149.0)
VLDL: 63 mg/dL — ABNORMAL HIGH (ref 0.0–40.0)

## 2023-09-19 LAB — VITAMIN D 25 HYDROXY (VIT D DEFICIENCY, FRACTURES): VITD: 19.63 ng/mL — ABNORMAL LOW (ref 30.00–100.00)

## 2023-09-19 LAB — PSA: PSA: 0.35 ng/mL (ref 0.10–4.00)

## 2023-09-19 LAB — VITAMIN B12: Vitamin B-12: 409 pg/mL (ref 211–911)

## 2023-09-19 LAB — TSH: TSH: 1.2 u[IU]/mL (ref 0.35–5.50)

## 2023-09-19 MED ORDER — ALPRAZOLAM 1 MG PO TABS: 1.0000 mg | ORAL_TABLET | Freq: Three times a day (TID) | ORAL | 2 refills | Status: DC | PRN
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
## 2023-09-19 NOTE — Assessment & Plan Note (Signed)
Last vitamin D Lab Results  Component Value Date   VD25OH 35.01 02/13/2023   Low, to start oral replacement
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
## 2023-09-19 NOTE — Assessment & Plan Note (Signed)
 Lab Results  Component Value Date   LDLCALC 75 02/13/2023   Stable, pt to continue lipitor 80 mg every day, for f/u lab

## 2023-09-19 NOTE — Assessment & Plan Note (Signed)
 Chronic mod to severe, cont xanax prn, delcines psychiatry or counsleing or SSRI

## 2023-09-19 NOTE — Patient Instructions (Signed)

## 2023-09-19 NOTE — Assessment & Plan Note (Signed)

## 2023-09-22 ENCOUNTER — Encounter: Payer: Self-pay | Admitting: Internal Medicine

## 2023-09-22 DIAGNOSIS — R202 Paresthesia of skin: Secondary | ICD-10-CM

## 2023-09-22 DIAGNOSIS — R413 Other amnesia: Secondary | ICD-10-CM

## 2023-09-22 LAB — HEMOGLOBIN A1C: Hgb A1c MFr Bld: 6.1 % (ref 4.6–6.5)

## 2023-09-22 NOTE — Progress Notes (Signed)
 The test results show that your current treatment is OK, as the tests are stable.  Please continue the same plan.  There is no other need for change of treatment or further evaluation based on these results, at this time.  thanks

## 2023-09-29 ENCOUNTER — Other Ambulatory Visit: Payer: Self-pay

## 2023-09-29 ENCOUNTER — Ambulatory Visit: Admitting: Orthopedic Surgery

## 2023-09-29 DIAGNOSIS — M5441 Lumbago with sciatica, right side: Secondary | ICD-10-CM

## 2023-09-29 DIAGNOSIS — M5416 Radiculopathy, lumbar region: Secondary | ICD-10-CM

## 2023-09-29 DIAGNOSIS — M4726 Other spondylosis with radiculopathy, lumbar region: Secondary | ICD-10-CM

## 2023-09-29 DIAGNOSIS — G8929 Other chronic pain: Secondary | ICD-10-CM | POA: Diagnosis not present

## 2023-09-29 NOTE — Progress Notes (Signed)
 Orthopedic Spine Surgery Office Note   Assessment: Patient is a 58 y.o. male with acute exacerbation of his chronic low back pain with radiating right leg pain.  He feels it along the lateral aspect of his right thigh and leg     Plan: -Patient has tried pain management, physical therapy, tylenol, medrol dosepak, steroid injections -Patient is using Percocet to control his pain and is not currently established with [facility] medical group.  Encouraged him to continue follow-up there -He was interested in a lumbar steroid injection to help with his flare of radiating leg pain.  Referral provided to him today -Patient should return to office on an as-needed basis     Patient expressed understanding of the plan and all questions were answered to the patient's satisfaction.    ___________________________________________________________________________     History:   Patient is a 58 y.o. male who presents today for follow-up on his lumbar spine.  Patient states for the last month he has noticed acute worsening of his chronic low back pain.  He he has also noticed pain radiating along the right lateral aspect of the thigh and leg.  There is no trauma or injury that preceded the onset of this worsening pain.  He has had no bowel or bladder incontinence.  No saddle anesthesia.  No weakness noted in the legs.  He ambulates without assistive devices.  He is currently established with [facility] medical group and is taking Percocet for his chronic pain.  Patient rates the pain as a 8 out of 10 at its worst.     Treatments tried: pain management, physical therapy, tylenol, medrol dosepak, steroid injections     Physical Exam:   General: no acute distress, appears stated age Neurologic: alert, answering questions appropriately, following commands Respiratory: unlabored breathing on room air, symmetric chest rise Psychiatric: appropriate affect, normal cadence to speech     MSK (spine):   -Strength  exam                                                   Left                  Right EHL                              5/5                  5/5 TA                                 5/5                  5/5 GSC                             5/5                  5/5 Knee extension            5/5                  5/5 Hip flexion  5/5                  5/5   -Sensory exam                           Sensation intact to light touch in L3-S1 nerve distributions of bilateral lower extremities     Imaging: XRs of the lumbar spine from 09/29/2023 were independently reviewed and interpreted, showing no coronal imbalance.  Lordotic alignment. No significant degenerative changes seen. No evidence of instability on flexion/extension views. No fracture or dislocation seen.   MRI of the lumbar spine from 04/19/2022 was previously independently reviewed and interpreted, showing small noncompressive disc herniation L1/2.  No other disc herniation seen.  No significant foraminal, central, or lateral recess stenosis. Disc desiccation at L2/3.      Patient name: Timothy Fuller Patient MRN: [id number] Date of visit: 09/29/23

## 2023-10-01 ENCOUNTER — Ambulatory Visit: Admitting: Orthopaedic Surgery

## 2023-10-02 ENCOUNTER — Ambulatory Visit: Admitting: Orthopedic Surgery

## 2023-10-06 ENCOUNTER — Ambulatory Visit: Admitting: Orthopaedic Surgery

## 2023-10-08 ENCOUNTER — Ambulatory Visit: Admitting: Physical Medicine and Rehabilitation

## 2023-10-08 ENCOUNTER — Other Ambulatory Visit: Payer: Self-pay

## 2023-10-08 VITALS — BP 132/76 | HR 76

## 2023-10-08 DIAGNOSIS — M5416 Radiculopathy, lumbar region: Secondary | ICD-10-CM

## 2023-10-08 NOTE — Progress Notes (Signed)
 Pain Scale   Average Pain 4 Patient advising his pain increases when standing and walking And when resting pain decreases- patient advising that his pain goes from lower back to right leg and foot appears to get Numb....    +Driver, -BT, -Dye Allergies.

## 2023-10-08 NOTE — Patient Instructions (Signed)

## 2023-10-08 NOTE — Progress Notes (Signed)
 Timothy Fuller - 58 y.o. male MRN [id number]  Date of birth: 1965-10-04  Office Visit Note: Visit Date: 10/08/2023 PCP: Roslyn Coombe, MD Referred by: Diedra Fowler, MD  Subjective: Chief Complaint  Patient presents with   Lower Back - Pain   HPI:  Timothy Fuller is a 58 y.o. male who comes in today at the request of Dr. Colette Davies for planned Right L5-S1 Lumbar Interlaminar epidural steroid injection with fluoroscopic guidance.  The patient has failed conservative care including home exercise, medications, time and activity modification.  This injection will be diagnostic and hopefully therapeutic.  Please see requesting physician notes for further details and justification.    ROS Otherwise per HPI.  Assessment & Plan: Visit Diagnoses:    ICD-10-CM   1. Lumbar radiculopathy  M54.16 XR C-ARM NO REPORT    Epidural Steroid injection      Plan: No additional findings.   Meds & Orders: No orders of the defined types were placed in this encounter.   Orders Placed This Encounter  Procedures   XR C-ARM NO REPORT   Epidural Steroid injection    Follow-up: Return for visit to requesting provider as needed.   Procedures: No procedures performed  Lumbosacral Transforaminal Epidural Steroid Injection - Sub-Pedicular Approach with Fluoroscopic Guidance  Patient: Timothy Fuller      Date of Birth: 12/14/1965 MRN: [id number] PCP: Roslyn Coombe, MD      Visit Date: 10/08/2023   Universal Protocol:    Date/Time: 10/08/2023  Consent Given By: the patient  Position: PRONE  Additional Comments: Vital signs were monitored before and after the procedure. Patient was prepped and draped in the usual sterile fashion. The correct patient, procedure, and site was verified.   Injection Procedure Details:   Procedure diagnoses: Lumbar radiculopathy [M54.16]    Meds Administered: 40mg  Depomedrol  Laterality: Right  Location/Site: L5  Needle:5.0 in., 22 ga.  Short bevel or Quincke  spinal needle  Needle Placement: Transforaminal  Findings:    -Comments: Excellent flow of contrast along the nerve, nerve root and into the epidural space.  Procedure Details: After squaring off the end-plates to get a true AP view, the C-arm was positioned so that an oblique view of the foramen as noted above was visualized. The target area is just inferior to the "nose of the scotty dog" or sub pedicular. The soft tissues overlying this structure were infiltrated with 2-3 ml. of 1% Lidocaine without Epinephrine.  The spinal needle was inserted toward the target using a "trajectory" view along the fluoroscope beam.  Under AP and lateral visualization, the needle was advanced so it did not puncture dura and was located close the 6 O'Clock position of the pedical in AP tracterory. Biplanar projections were used to confirm position. Aspiration was confirmed to be negative for CSF and/or blood. A 1-2 ml. volume of Isovue-250 was injected and flow of contrast was noted at each level. Radiographs were obtained for documentation purposes.   After attaining the desired flow of contrast documented above, a 0.5 to 1.0 ml test dose of 0.25% Marcaine was injected into each respective transforaminal space.  The patient was observed for 90 seconds post injection.  After no sensory deficits were reported, and normal lower extremity motor function was noted,   the above injectate was administered so that equal amounts of the injectate were placed at each foramen (level) into the transforaminal epidural space.   Additional Comments:  The patient tolerated the procedure well  Dressing: 2 x 2 sterile gauze and Band-Aid    Post-procedure details: Patient was observed during the procedure. Post-procedure instructions were reviewed.  Patient left the clinic in stable condition.    Clinical History: Lumbar MRI Imaging [facility]) 05/28/2022   Impression: 1. Small central disc  herniation at L1-L2 extending superiorly behind L1. No mass effect on the cord or nerve roots. 2. 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 3. Otherwise normal     Objective:  VS:  HT:    WT:   BMI:     BP:132/76  HR:76bpm  TEMP: ( )  RESP:  Physical Exam Vitals and nursing note reviewed.  Constitutional:      General: He is not in acute distress.    Appearance: Normal appearance. He is not ill-appearing.  HENT:     Head: Normocephalic and atraumatic.     Right Ear: External ear normal.     Left Ear: External ear normal.     Nose: No congestion.  Eyes:     Extraocular Movements: Extraocular movements intact.  Cardiovascular:     Rate and Rhythm: Normal rate.     Pulses: Normal pulses.  Pulmonary:     Effort: Pulmonary effort is normal. No respiratory distress.  Abdominal:     General: There is no distension.     Palpations: Abdomen is soft.  Musculoskeletal:        General: No tenderness or signs of injury.     Cervical back: Neck supple.     Right lower leg: No edema.     Left lower leg: No edema.     Comments: Patient has good distal strength without clonus.  Skin:    Findings: No erythema or rash.  Neurological:     General: No focal deficit present.     Mental Status: He is alert and oriented to person, place, and time.     Sensory: No sensory deficit.     Motor: No weakness or abnormal muscle tone.     Coordination: Coordination normal.  Psychiatric:        Mood and Affect: Mood normal.        Behavior: Behavior normal.      Imaging: No results found.

## 2023-10-09 ENCOUNTER — Ambulatory Visit: Payer: Managed Care, Other (non HMO) | Admitting: Internal Medicine

## 2023-10-09 MED ORDER — AMLODIPINE BESYLATE 10 MG PO TABS: 10.0000 mg | ORAL_TABLET | Freq: Every day | ORAL | 3 refills | Status: AC

## 2023-10-14 NOTE — Procedures (Signed)
 Lumbosacral Transforaminal Epidural Steroid Injection - Sub-Pedicular Approach with Fluoroscopic Guidance  Patient: Timothy Fuller      Date of Birth: 10-27-1965 MRN: [id number] PCP: Roslyn Coombe, MD      Visit Date: 10/08/2023   Universal Protocol:    Date/Time: 10/08/2023  Consent Given By: the patient  Position: PRONE  Additional Comments: Vital signs were monitored before and after the procedure. Patient was prepped and draped in the usual sterile fashion. The correct patient, procedure, and site was verified.   Injection Procedure Details:   Procedure diagnoses: Lumbar radiculopathy [M54.16]    Meds Administered: 40mg  Depomedrol  Laterality: Right  Location/Site: L5  Needle:5.0 in., 22 ga.  Short bevel or Quincke spinal needle  Needle Placement: Transforaminal  Findings:    -Comments: Excellent flow of contrast along the nerve, nerve root and into the epidural space.  Procedure Details: After squaring off the end-plates to get a true AP view, the C-arm was positioned so that an oblique view of the foramen as noted above was visualized. The target area is just inferior to the "nose of the scotty dog" or sub pedicular. The soft tissues overlying this structure were infiltrated with 2-3 ml. of 1% Lidocaine without Epinephrine.  The spinal needle was inserted toward the target using a "trajectory" view along the fluoroscope beam.  Under AP and lateral visualization, the needle was advanced so it did not puncture dura and was located close the 6 O'Clock position of the pedical in AP tracterory. Biplanar projections were used to confirm position. Aspiration was confirmed to be negative for CSF and/or blood. A 1-2 ml. volume of Isovue-250 was injected and flow of contrast was noted at each level. Radiographs were obtained for documentation purposes.   After attaining the desired flow of contrast documented above, a 0.5 to 1.0 ml test dose of 0.25% Marcaine was injected into each  respective transforaminal space.  The patient was observed for 90 seconds post injection.  After no sensory deficits were reported, and normal lower extremity motor function was noted,   the above injectate was administered so that equal amounts of the injectate were placed at each foramen (level) into the transforaminal epidural space.   Additional Comments:  The patient tolerated the procedure well Dressing: 2 x 2 sterile gauze and Band-Aid    Post-procedure details: Patient was observed during the procedure. Post-procedure instructions were reviewed.  Patient left the clinic in stable condition.

## 2023-10-27 NOTE — Addendum Note (Signed)
 Addended by: Roslyn Coombe on: 10/27/2023 11:35 AM   Modules accepted: Orders

## 2023-10-29 ENCOUNTER — Telehealth: Payer: Self-pay

## 2023-10-29 NOTE — Telephone Encounter (Signed)
 Copied from CRM [id number]. Topic: Clinical - Medical Advice >> Oct 29, 2023  1:44 PM Isabell A wrote: Reason for CRM: Patient hasn't been seen since 2021, scheduled for 7/14 - but would like assistance receiving his CPAP supplies, he is not sure what to do.  I called and spoke to pt. I advised pt that he would have to be seen and established as a new pt before we are able to RX anything for him. I advised pt that he could try [email or web address] for supplies in the mean time. Pt verbalized understanding. Pt also stated he needs a new CPAP machine and wanted to know if he could have one RX at his upcoming appointment. I advised pt this should not be a problem, and once we see him, we could RX him CPAP supplies as long as we at least see him yearly or better off when the p[provider requests it. NFN

## 2023-11-03 NOTE — Telephone Encounter (Signed)
 Ok to let pt know unfortunately I have nothing to offer or assist with this process  Forward to [facility] thanks

## 2023-11-11 NOTE — Telephone Encounter (Signed)
 Route to [facility] thanks

## 2023-11-12 ENCOUNTER — Ambulatory Visit
Admission: EM | Admit: 2023-11-12 | Discharge: 2023-11-12 | Disposition: A | Discharge status: Home / Self Care | Attending: Internal Medicine | Admitting: Internal Medicine

## 2023-11-12 DIAGNOSIS — G44209 Tension-type headache, unspecified, not intractable: Secondary | ICD-10-CM

## 2023-11-12 DIAGNOSIS — S069XAA Unspecified intracranial injury with loss of consciousness status unknown, initial encounter: Secondary | ICD-10-CM

## 2023-11-12 HISTORY — DX: Unspecified intracranial injury with loss of consciousness status unknown, initial encounter: S06.9XAA

## 2023-11-12 MED ORDER — KETOROLAC TROMETHAMINE 30 MG/ML IJ SOLN
30.0000 mg | Freq: Once | INTRAMUSCULAR | Status: AC
  Administered 2023-11-12: 30 mg via INTRAMUSCULAR

## 2023-11-12 MED ORDER — DEXAMETHASONE SODIUM PHOSPHATE 10 MG/ML IJ SOLN
10.0000 mg | Freq: Once | INTRAMUSCULAR | Status: AC
  Administered 2023-11-12: 10 mg via INTRAMUSCULAR

## 2023-11-12 NOTE — ED Triage Notes (Signed)
"  This started last night with ha/migraine last night (and they usually last 2-3 days) so wanted to come in at the beginning". No other symptoms. "I have a PCP, unavailable".
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
## 2023-11-12 NOTE — Discharge Instructions (Signed)

## 2023-11-17 ENCOUNTER — Telehealth: Payer: Self-pay | Admitting: Diagnostic Neuroimaging

## 2023-11-17 NOTE — Telephone Encounter (Signed)
 Returned patient's VM, he had left message on Friday 5/16 when we were out of the office to schedule appt-referral from Urgent Care. Had to LVM for patient

## 2023-11-21 ENCOUNTER — Ambulatory Visit
Admission: EM | Admit: 2023-11-21 | Discharge: 2023-11-21 | Disposition: A | Discharge status: Home / Self Care | Attending: Family Medicine | Admitting: Family Medicine

## 2023-11-21 DIAGNOSIS — Z202 Contact with and (suspected) exposure to infections with a predominantly sexual mode of transmission: Secondary | ICD-10-CM | POA: Diagnosis present
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
## 2023-11-21 NOTE — ED Triage Notes (Signed)
"  I want to get tested for STI's (Chlamydia exposure), No symptoms". To discuss blood work.

## 2023-11-21 NOTE — Discharge Instructions (Signed)
 Staff will notify you if there is anything positive on the swab or on the blood work.

## 2023-11-22 LAB — HIV ANTIBODY (ROUTINE TESTING W REFLEX): HIV Screen 4th Generation wRfx: NONREACTIVE

## 2023-11-22 LAB — RPR: RPR Ser Ql: NONREACTIVE

## 2023-11-25 LAB — CYTOLOGY, (ORAL, ANAL, URETHRAL) ANCILLARY ONLY
Chlamydia: NEGATIVE
Comment: NEGATIVE
Comment: NEGATIVE
Comment: NORMAL
Neisseria Gonorrhea: NEGATIVE
Trichomonas: NEGATIVE

## 2023-11-27 DIAGNOSIS — R3129 Other microscopic hematuria: Secondary | ICD-10-CM | POA: Insufficient documentation

## 2023-12-02 ENCOUNTER — Ambulatory Visit: Admitting: Internal Medicine

## 2023-12-11 ENCOUNTER — Ambulatory Visit: Admitting: Family Medicine

## 2023-12-18 ENCOUNTER — Ambulatory Visit: Admitting: Internal Medicine

## 2023-12-22 ENCOUNTER — Encounter: Payer: Self-pay | Admitting: Internal Medicine

## 2023-12-22 ENCOUNTER — Ambulatory Visit: Admitting: Internal Medicine

## 2023-12-22 VITALS — BP 144/88 | HR 85 | Temp 97.7°F | Ht 72.0 in | Wt 220.0 lb

## 2023-12-22 DIAGNOSIS — G8929 Other chronic pain: Secondary | ICD-10-CM | POA: Diagnosis not present

## 2023-12-22 DIAGNOSIS — Z202 Contact with and (suspected) exposure to infections with a predominantly sexual mode of transmission: Secondary | ICD-10-CM | POA: Diagnosis not present

## 2023-12-22 DIAGNOSIS — J069 Acute upper respiratory infection, unspecified: Secondary | ICD-10-CM | POA: Diagnosis not present

## 2023-12-22 DIAGNOSIS — E559 Vitamin D deficiency, unspecified: Secondary | ICD-10-CM | POA: Diagnosis not present

## 2023-12-22 DIAGNOSIS — I10 Essential (primary) hypertension: Secondary | ICD-10-CM | POA: Diagnosis not present

## 2023-12-22 MED ORDER — AZITHROMYCIN 250 MG PO TABS: ORAL_TABLET | ORAL | 1 refills | Status: AC

## 2023-12-22 MED ORDER — SILDENAFIL CITRATE 20 MG PO TABS: ORAL_TABLET | ORAL | 5 refills | Status: AC

## 2023-12-22 MED ORDER — OXYCODONE-ACETAMINOPHEN 5-325 MG PO TABS: 1.0000 | ORAL_TABLET | Freq: Four times a day (QID) | ORAL | 0 refills | Status: DC | PRN

## 2023-12-22 NOTE — Assessment & Plan Note (Signed)
 With recent herpes 1 and 2 testing positive so unclear which or both is abnormal, so for f/u testing herpes 2 IgG today

## 2023-12-22 NOTE — Assessment & Plan Note (Signed)
 BP Readings from Last 3 Encounters:  12/22/23 (!) 144/88  11/21/23 (!) 150/91  11/12/23 111/73   Uncontrolled, but pt states better controlled at home,, pt to continue medical treatment norvasc  10 every day,
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
## 2023-12-22 NOTE — Patient Instructions (Addendum)
 Please take all new medication as prescribed - the antibiotic  You are given the work note today  Please continue all other medications as before, and refills have been done if requested - percocet  Please have the pharmacy call with any other refills you may need.  Please keep your appointments with your specialists as you may have planned  You will be contacted regarding the referral for: Pain Clinic  Please go to the LAB at the blood drawing area for the tests to be done  You will be contacted by phone if any changes need to be made immediately.  Otherwise, you will receive a letter about your results with an explanation, but please check with MyChart first.

## 2023-12-22 NOTE — Assessment & Plan Note (Signed)
 Last vitamin D  Lab Results  Component Value Date   VD25OH 19.63 (L) 09/19/2023   Low, to start oral replacement

## 2023-12-22 NOTE — Assessment & Plan Note (Signed)
Mild to mod, for antibx course zpack, ,  to f/u any worsening symptoms or concerns

## 2023-12-22 NOTE — Assessment & Plan Note (Signed)
 Now for bridging med refills, refer pain management

## 2023-12-23 ENCOUNTER — Encounter: Payer: Self-pay | Admitting: Internal Medicine

## 2023-12-23 ENCOUNTER — Ambulatory Visit: Payer: Self-pay | Admitting: Internal Medicine

## 2023-12-23 DIAGNOSIS — A6000 Herpesviral infection of urogenital system, unspecified: Secondary | ICD-10-CM | POA: Insufficient documentation

## 2023-12-23 LAB — HSV 2 ANTIBODY, IGG: HSV 2 Glycoprotein G Ab, IgG: 10.8 {index} — ABNORMAL HIGH

## 2024-01-12 ENCOUNTER — Ambulatory Visit (INDEPENDENT_AMBULATORY_CARE_PROVIDER_SITE_OTHER): Admitting: Pulmonary Disease

## 2024-01-12 ENCOUNTER — Encounter: Payer: Self-pay | Admitting: Pulmonary Disease

## 2024-01-12 VITALS — BP 101/66 | HR 73 | Ht 72.0 in | Wt 214.0 lb

## 2024-01-12 DIAGNOSIS — G4733 Obstructive sleep apnea (adult) (pediatric): Secondary | ICD-10-CM | POA: Diagnosis not present

## 2024-01-12 DIAGNOSIS — Z87891 Personal history of nicotine dependence: Secondary | ICD-10-CM | POA: Diagnosis not present

## 2024-01-12 MED ORDER — FLUTICASONE PROPIONATE 50 MCG/ACT NA SUSP: 2.0000 | Freq: Every day | NASAL | 3 refills | Status: AC

## 2024-01-12 NOTE — Addendum Note (Signed)
 Addended by: KARNA CONSUELO PARAS on: 01/12/2024 01:35 PM   Modules accepted: Orders

## 2024-01-12 NOTE — Patient Instructions (Addendum)
 Continue using your CPAP Your machine is working well Number of events is improved  We will place an order for CPAP supplies  DME referral for a new CPAP Auto CPAP 5-20 with heated humidification  Follow-up a year from now
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
## 2024-01-22 ENCOUNTER — Other Ambulatory Visit: Payer: Self-pay | Admitting: Internal Medicine

## 2024-01-22 MED ORDER — ALPRAZOLAM 1 MG PO TABS: 1.0000 mg | ORAL_TABLET | Freq: Three times a day (TID) | ORAL | 2 refills | Status: DC | PRN

## 2024-01-22 NOTE — Telephone Encounter (Signed)
 Copied from CRM #[id number]. Topic: Clinical - Medication Refill >> Jan 22, 2024  1:19 PM [place] J wrote: Medication: ALPRAZolam  (XANAX ) 1 MG tablet  Has the patient contacted their pharmacy? Yes (Agent: If no, request that the patient contact the pharmacy for the refill. If patient does not wish to contact the pharmacy document the reason why and proceed with request.) (Agent: If yes, when and what did the pharmacy advise?) Pharmacy did not have refills available.  This is the patient's preferred pharmacy:  [facility] [address], [place] - [address] [place] [address] Phone: [phone] Fax: [phone]  Is this the correct pharmacy for this prescription? Yes If no, delete pharmacy and type the correct one.   Has the prescription been filled recently? No  Is the patient out of the medication? Yes  Has the patient been seen for an appointment in the last year OR does the patient have an upcoming appointment? Yes  Can we respond through MyChart? Yes  Agent: Please be advised that Rx refills may take up to 3 business days. We ask that you follow-up with your pharmacy.

## 2024-02-02 ENCOUNTER — Encounter: Payer: Self-pay | Admitting: Internal Medicine

## 2024-02-02 ENCOUNTER — Ambulatory Visit: Admitting: Internal Medicine

## 2024-02-02 VITALS — BP 126/84 | HR 64 | Temp 98.5°F | Ht 72.0 in | Wt 214.2 lb

## 2024-02-02 DIAGNOSIS — E559 Vitamin D deficiency, unspecified: Secondary | ICD-10-CM

## 2024-02-02 DIAGNOSIS — I10 Essential (primary) hypertension: Secondary | ICD-10-CM | POA: Diagnosis not present

## 2024-02-02 DIAGNOSIS — E7800 Pure hypercholesterolemia, unspecified: Secondary | ICD-10-CM | POA: Diagnosis not present

## 2024-02-02 DIAGNOSIS — K529 Noninfective gastroenteritis and colitis, unspecified: Secondary | ICD-10-CM

## 2024-02-02 NOTE — Assessment & Plan Note (Signed)
 Lab Results  Component Value Date   LDLCALC 51 09/19/2023   Stable, pt to continue current statin lipitor 80 mg qd

## 2024-02-02 NOTE — Assessment & Plan Note (Signed)
 BP Readings from Last 3 Encounters:  02/02/24 126/84  01/12/24 101/66  12/22/23 (!) 144/88   Stable, pt to continue medical treatment norvasc  5 every day,
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
## 2024-02-02 NOTE — Assessment & Plan Note (Signed)
?   Viral vs functional - much improved today, exam benign, and non toxic - declines need for lab or other stool testing, now for work note, immodium prn,  to f/u any worsening symptoms or concerns

## 2024-02-02 NOTE — Assessment & Plan Note (Signed)
 Last vitamin D  Lab Results  Component Value Date   VD25OH 19.63 (L) 09/19/2023   Low, to start oral replacement

## 2024-02-02 NOTE — Patient Instructions (Signed)
Please continue all other medications as before, and refills have been done if requested.  Please have the pharmacy call with any other refills you may need.  Please keep your appointments with your specialists as you may have planned  You are given the work note today 

## 2024-02-03 ENCOUNTER — Telehealth: Payer: Self-pay

## 2024-02-03 NOTE — Telephone Encounter (Signed)
Ok sure, thanks

## 2024-02-03 NOTE — Telephone Encounter (Signed)
 Copied from CRM #[id number]. Topic: General - Other >> Feb 03, 2024  8:05 AM Henretta I wrote: Reason for CRM: Patient needs another doctors note for today as he is still not feeling well,  Patient would like it sent to email [email or web address]

## 2024-03-03 ENCOUNTER — Other Ambulatory Visit: Payer: Self-pay | Admitting: Internal Medicine

## 2024-03-05 ENCOUNTER — Other Ambulatory Visit: Payer: Self-pay

## 2024-03-05 MED ORDER — ATORVASTATIN CALCIUM 80 MG PO TABS: 80.0000 mg | ORAL_TABLET | Freq: Every day | ORAL | 3 refills | Status: AC

## 2024-03-15 ENCOUNTER — Encounter: Payer: Self-pay | Admitting: Diagnostic Neuroimaging

## 2024-03-15 ENCOUNTER — Ambulatory Visit: Admitting: Diagnostic Neuroimaging

## 2024-04-21 ENCOUNTER — Ambulatory Visit: Admitting: Internal Medicine

## 2024-05-03 ENCOUNTER — Encounter: Payer: Self-pay | Admitting: Radiology

## 2024-05-12 ENCOUNTER — Ambulatory Visit: Admitting: Internal Medicine

## 2024-05-12 VITALS — BP 120/76 | HR 68 | Temp 97.7°F | Ht 72.0 in | Wt 221.2 lb

## 2024-05-12 DIAGNOSIS — I10 Essential (primary) hypertension: Secondary | ICD-10-CM | POA: Diagnosis not present

## 2024-05-12 DIAGNOSIS — E7800 Pure hypercholesterolemia, unspecified: Secondary | ICD-10-CM

## 2024-05-12 DIAGNOSIS — E559 Vitamin D deficiency, unspecified: Secondary | ICD-10-CM

## 2024-05-12 DIAGNOSIS — F419 Anxiety disorder, unspecified: Secondary | ICD-10-CM | POA: Diagnosis not present

## 2024-05-12 MED ORDER — ALPRAZOLAM 1 MG PO TABS: 1.0000 mg | ORAL_TABLET | Freq: Three times a day (TID) | ORAL | 2 refills | Status: DC | PRN

## 2024-05-12 NOTE — Patient Instructions (Signed)
 You are given the work note today  Please continue all other medications as before, and refills have been done for the xanax   Please have the pharmacy call with any other refills you may need.  Please continue your efforts at being more active, low cholesterol diet, and weight control.  Please keep your appointments with your specialists as you may have planned  You will be contacted regarding the referral for: Counseling (psychology)  We can hold on lab test today  Please make an Appointment to return in 6 months, or sooner if needed
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
## 2024-05-14 ENCOUNTER — Ambulatory Visit: Admitting: Internal Medicine

## 2024-05-14 ENCOUNTER — Encounter: Payer: Self-pay | Admitting: Internal Medicine

## 2024-05-14 VITALS — BP 124/82 | HR 73 | Temp 97.8°F | Ht 72.0 in | Wt 225.0 lb

## 2024-05-14 DIAGNOSIS — I10 Essential (primary) hypertension: Secondary | ICD-10-CM | POA: Diagnosis not present

## 2024-05-14 DIAGNOSIS — E7800 Pure hypercholesterolemia, unspecified: Secondary | ICD-10-CM | POA: Diagnosis not present

## 2024-05-14 DIAGNOSIS — E559 Vitamin D deficiency, unspecified: Secondary | ICD-10-CM | POA: Diagnosis not present

## 2024-05-14 DIAGNOSIS — F419 Anxiety disorder, unspecified: Secondary | ICD-10-CM | POA: Diagnosis not present

## 2024-05-14 NOTE — Assessment & Plan Note (Addendum)
 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  Cont xanax  prn, declines other change

## 2024-05-14 NOTE — Assessment & Plan Note (Signed)
 Last vitamin D  Lab Results  Component Value Date   VD25OH 19.63 (L) 09/19/2023   Low, to start oral replacement
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
## 2024-05-14 NOTE — Assessment & Plan Note (Signed)
 BP Readings from Last 3 Encounters:  05/14/24 124/82  05/12/24 120/76  02/02/24 126/84   Stable, pt to continue medical treatment norvasc  10 mg every day,

## 2024-05-14 NOTE — Assessment & Plan Note (Signed)
 Lab Results  Component Value Date   LDLCALC 51 09/19/2023   Stable, pt to continue current statin lipitor 80 mg qd

## 2024-05-14 NOTE — Patient Instructions (Signed)
 Your doctor visit note from today is done  Your anxiety accomodation letter is done  Please continue all other medications as before, and refills have been done if requested.  Please have the pharmacy call with any other refills you may need.  Please continue your efforts at being more active, low cholesterol diet, and weight control.  Please keep your appointments with your specialists as you may have planned

## 2024-05-15 ENCOUNTER — Encounter: Payer: Self-pay | Admitting: Internal Medicine

## 2024-05-15 NOTE — Assessment & Plan Note (Signed)
 Last vitamin D  Lab Results  Component Value Date   VD25OH 19.63 (L) 09/19/2023   Low, reminded to start oral replacement

## 2024-05-15 NOTE — Assessment & Plan Note (Signed)
 Lab Results  Component Value Date   LDLCALC 51 09/19/2023   Stable, pt to continue current statin lipitor 80 mg qd

## 2024-05-15 NOTE — Assessment & Plan Note (Signed)
 BP Readings from Last 3 Encounters:  05/14/24 124/82  05/12/24 120/76  02/02/24 126/84   Stable, pt to continue medical treatment norvasc  10 qd

## 2024-05-15 NOTE — Assessment & Plan Note (Signed)
 Mod to severe flare without panic last 2 days, pt encouraged to keep counseling appt, ok for xanax  prn asd, gave doctor note for today and tomorrow, and also a work heritage manager

## 2024-05-17 ENCOUNTER — Encounter (HOSPITAL_COMMUNITY): Payer: Self-pay

## 2024-05-17 ENCOUNTER — Emergency Department (HOSPITAL_COMMUNITY)
Admission: EM | Admit: 2024-05-17 | Discharge: 2024-05-17 | Disposition: A | Discharge status: Home / Self Care | Attending: Emergency Medicine | Admitting: Emergency Medicine

## 2024-05-17 ENCOUNTER — Other Ambulatory Visit: Payer: Self-pay

## 2024-05-17 DIAGNOSIS — Z79899 Other long term (current) drug therapy: Secondary | ICD-10-CM | POA: Insufficient documentation

## 2024-05-17 DIAGNOSIS — Z5329 Procedure and treatment not carried out because of patient's decision for other reasons: Secondary | ICD-10-CM | POA: Insufficient documentation

## 2024-05-17 DIAGNOSIS — R0789 Other chest pain: Secondary | ICD-10-CM | POA: Diagnosis not present

## 2024-05-17 DIAGNOSIS — I10 Essential (primary) hypertension: Secondary | ICD-10-CM | POA: Diagnosis not present

## 2024-05-17 DIAGNOSIS — R112 Nausea with vomiting, unspecified: Secondary | ICD-10-CM | POA: Insufficient documentation

## 2024-05-17 LAB — URINALYSIS, MICROSCOPIC (REFLEX)

## 2024-05-17 LAB — LIPASE, BLOOD: Lipase: 29 U/L (ref 11–51)

## 2024-05-17 LAB — CBC
HCT: 47.9 % (ref 39.0–52.0)
Hemoglobin: 15.7 g/dL (ref 13.0–17.0)
MCH: 26.4 pg (ref 26.0–34.0)
MCHC: 32.8 g/dL (ref 30.0–36.0)
MCV: 80.5 fL (ref 80.0–100.0)
Platelets: 322 K/uL (ref 150–400)
RBC: 5.95 MIL/uL — ABNORMAL HIGH (ref 4.22–5.81)
RDW: 13.5 % (ref 11.5–15.5)
WBC: 9.8 K/uL (ref 4.0–10.5)
nRBC: 0 % (ref 0.0–0.2)

## 2024-05-17 LAB — URINALYSIS, ROUTINE W REFLEX MICROSCOPIC
Bilirubin Urine: NEGATIVE
Glucose, UA: NEGATIVE mg/dL
Ketones, ur: NEGATIVE mg/dL
Leukocytes,Ua: NEGATIVE
Nitrite: NEGATIVE
Protein, ur: NEGATIVE mg/dL
Specific Gravity, Urine: 1.02 (ref 1.005–1.030)
pH: 6 (ref 5.0–8.0)

## 2024-05-17 LAB — COMPREHENSIVE METABOLIC PANEL WITH GFR
ALT: 23 U/L (ref 0–44)
AST: 22 U/L (ref 15–41)
Albumin: 3.9 g/dL (ref 3.5–5.0)
Alkaline Phosphatase: 54 U/L (ref 38–126)
Anion gap: 11 (ref 5–15)
BUN: 10 mg/dL (ref 6–20)
CO2: 25 mmol/L (ref 22–32)
Calcium: 9.3 mg/dL (ref 8.9–10.3)
Chloride: 101 mmol/L (ref 98–111)
Creatinine, Ser: 0.99 mg/dL (ref 0.61–1.24)
GFR, Estimated: >60 mL/min (ref >60)
Glucose, Bld: 108 mg/dL — ABNORMAL HIGH (ref 70–99)
Potassium: 4.1 mmol/L (ref 3.5–5.1)
Sodium: 137 mmol/L (ref 135–145)
Total Bilirubin: 0.7 mg/dL (ref 0.0–1.2)
Total Protein: 7.5 g/dL (ref 6.5–8.1)

## 2024-05-17 MED ORDER — ONDANSETRON HCL 4 MG PO TABS: 4.0000 mg | ORAL_TABLET | Freq: Four times a day (QID) | ORAL | 0 refills | Status: AC

## 2024-05-17 MED ORDER — ONDANSETRON 4 MG PO TBDP
8.0000 mg | ORAL_TABLET | Freq: Once | ORAL | Status: AC
  Administered 2024-05-17: 8 mg via ORAL
  Filled 2024-05-17: qty 2

## 2024-05-17 NOTE — Discharge Instructions (Signed)
 Evaluation today was overall reassuring.  Suspect this could be a foodborne illness or viral gastroenteritis.  Treatment at home is the same which is primarily assertive hydration with water and Gatorade.  Sent Zofran  as well to pharmacy to help with nausea and vomiting at home.  Please follow-up with your PCP.  If your symptoms worsen please return to the ED for further evaluation.

## 2024-05-17 NOTE — ED Provider Notes (Cosign Needed Addendum)
 [facility] Provider Note   CSN: [id number] Arrival date & time: 05/17/24  9561     Patient presents with: Emesis  HPI Timothy Fuller is a 58 y.o. male with hypertension, high cholesterol, seizures, TBI presenting for vomiting.  He states this started all of a sudden around 2 AM this morning.  He vomited a couple of times and that subsided around 3 AM.  Reports he had :[employer] last night for dinner.  Shortly after vomiting had some discomfort in the center of his chest that lasted for 20 minutes and resolved.  Denies associated shortness of breath.  The chest discomfort was not worse with exertion and nonradiating.  He denies abdominal pain.  At this time he has no symptoms.  Denies recent alcohol or marijuana use.  Denies urinary symptoms. Patient also requesting a work note.     Emesis      Prior to Admission medications   Medication Sig Start Date End Date Taking? Authorizing Provider  ALPRAZolam  (XANAX ) 1 MG tablet Take 1 tablet (1 mg total) by mouth 3 (three) times daily as needed. 05/12/24   Norleen Lynwood ORN, MD  amLODipine  (NORVASC ) 10 MG tablet Take 1 tablet (10 mg total) by mouth daily. 10/09/23   Norleen Lynwood ORN, MD  amLODipine  (NORVASC ) 5 MG tablet Take 5 mg by mouth daily. 11/08/23   [provider]  atorvastatin  (LIPITOR) 80 MG tablet Take 1 tablet (80 mg total) by mouth daily. 03/05/24   Norleen Lynwood ORN, MD  fluticasone  (FLONASE ) 50 MCG/ACT nasal spray Place 2 sprays into both nostrils daily. 01/12/24   Olalere, Adewale A, MD  lidocaine (LIDODERM) 5 % 1 patch daily. 12/09/23   [provider]  Multiple Vitamin (MULTIVITAMIN) tablet Take 1 tablet by mouth daily.    [provider]  oxyCODONE -acetaminophen  (PERCOCET/ROXICET) 5-325 MG tablet Take 1 tablet by mouth every 6 (six) hours as needed for severe pain (pain score 7-10). 12/22/23   Norleen Lynwood ORN, MD  sildenafil  (REVATIO ) 20 MG tablet TAKE 3-5 TABLETS BY MOUTH  AS NEEDED 12/22/23   Norleen Lynwood ORN, MD  traZODone  (DESYREL ) 100 MG tablet Take 1 tablet (100 mg total) by mouth at bedtime as needed for sleep. 04/10/23   Norleen Lynwood ORN, MD    Allergies: Penicillin g and Penicillins    Review of Systems  Gastrointestinal:  Positive for vomiting.    Updated Vital Signs BP (!) 141/92 (BP Location: Left Arm)   Pulse 97   Temp (!) 97.5 F (36.4 C)   Resp 20   SpO2 98%   Physical Exam Vitals and nursing note reviewed.  HENT:     Head: Normocephalic and atraumatic.     Mouth/Throat:     Mouth: Mucous membranes are moist.  Eyes:     General:        Right eye: No discharge.        Left eye: No discharge.     Conjunctiva/sclera: Conjunctivae normal.  Cardiovascular:     Rate and Rhythm: Normal rate and regular rhythm.     Pulses: Normal pulses.     Heart sounds: Normal heart sounds.  Pulmonary:     Effort: Pulmonary effort is normal.     Breath sounds: Normal breath sounds.  Abdominal:     General: Abdomen is flat. There is no distension.     Palpations: Abdomen is soft.     Tenderness: There is no abdominal tenderness.  Skin:    General: Skin is warm and dry.  Neurological:     General: No focal deficit present.  Psychiatric:        Mood and Affect: Mood normal.     (all labs ordered are listed, but only abnormal results are displayed) Labs Reviewed  COMPREHENSIVE METABOLIC PANEL WITH GFR - Abnormal; Notable for the following components:      Result Value   Glucose, Bld 108 (*)    All other components within normal limits  CBC - Abnormal; Notable for the following components:   RBC 5.95 (*)    All other components within normal limits  URINALYSIS, ROUTINE W REFLEX MICROSCOPIC - Abnormal; Notable for the following components:   Hgb urine dipstick SMALL (*)    All other components within normal limits  URINALYSIS, MICROSCOPIC (REFLEX) - Abnormal; Notable for the following components:   Bacteria, UA RARE (*)    All other components  within normal limits  LIPASE, BLOOD    EKG: None  Radiology: No results found.   Procedures   Medications Ordered in the ED  ondansetron  (ZOFRAN -ODT) disintegrating tablet 8 mg (has no administration in time range)                                    Medical Decision Making Amount and/or Complexity of Data Reviewed Labs: ordered.  Risk Prescription drug management.   Initial Impression and Ddx 58 year old well-appearing male presenting for vomiting and chest discomfort.  Exam was unremarkable.  DDx includes acute pancreatitis, ACS, PE, aortic dissection, acute cholecystitis, kidney stone, dehydration, electrolyte derangement, other. Patient PMH that increases complexity of ED encounter:  hypertension, high cholesterol, seizures, TBI  Interpretation of Diagnostics - I independent reviewed and interpreted the labs as followed: Unremarkable  - I personally reviewed and interpreted EKG which revealed NSR  Patient Reassessment and Ultimate Disposition/Management On reassessment patient remained asymptomatic and no vomiting during this encounter. Workup unremarkable. Suspect foodborne illness or viral gastroenteritis.  Fluid challenge here after Zofran  with no issue.  Discussed return precautions.  Sent Zofran  to his pharmacy and advised assertive hydration at home.  Advised PCP follow-up.  Discharged.  Suspect his chest comfort likely related to his vomiting.  Doubt ACS given reassuring EKG and no chest pain during this encounter.  Also doubt PE given nonpleuritic chest discomfort and lack of other symptoms.  Patient management required discussion with the following services or consulting groups:  None  Complexity of Problems Addressed Acute complicated illness or Injury  Additional Data Reviewed and Analyzed Further history obtained from: Past medical history and medications listed in the EMR and Prior ED visit notes  Patient Encounter Risk Assessment Consideration of  hospitalization      Final diagnoses:  Nausea and vomiting, unspecified vomiting type    ED Discharge Orders     None          Lang Norleen POUR, PA-C 05/17/24 1043    Lang Norleen POUR, PA-C 05/17/24 1044    Levander Houston, MD 05/20/24 1456

## 2024-05-17 NOTE — ED Triage Notes (Signed)
 Pt is coming in for emesis that occurred a couple of hours ago, he mentions after vomiting he has some chest pain as well. These symptoms have since subsided and improved but he is concerned with the episode of vomiting since he does no vomit a lot like he did. He is otherwise stable at this time in triage and cooperative.

## 2024-05-17 NOTE — ED Notes (Signed)
 Pt is not in lobby called name x3

## 2024-05-20 ENCOUNTER — Ambulatory Visit: Admitting: Internal Medicine

## 2024-05-20 ENCOUNTER — Ambulatory Visit: Payer: Self-pay

## 2024-05-20 ENCOUNTER — Encounter: Payer: Self-pay | Admitting: Internal Medicine

## 2024-05-20 VITALS — BP 124/82 | HR 88 | Temp 98.2°F | Ht 72.0 in | Wt 223.0 lb

## 2024-05-20 DIAGNOSIS — I10 Essential (primary) hypertension: Secondary | ICD-10-CM | POA: Diagnosis not present

## 2024-05-20 DIAGNOSIS — E7800 Pure hypercholesterolemia, unspecified: Secondary | ICD-10-CM | POA: Diagnosis not present

## 2024-05-20 DIAGNOSIS — F419 Anxiety disorder, unspecified: Secondary | ICD-10-CM | POA: Diagnosis not present

## 2024-05-20 DIAGNOSIS — E559 Vitamin D deficiency, unspecified: Secondary | ICD-10-CM | POA: Diagnosis not present
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
## 2024-05-20 NOTE — Assessment & Plan Note (Signed)
 Lab Results  Component Value Date   LDLCALC 51 09/19/2023   Stable, pt to continue current statin lipitor 80 mg qd

## 2024-05-20 NOTE — Telephone Encounter (Signed)
 FYI Only or Action Required?: FYI only for provider: appointment scheduled on today.  Patient was last seen in primary care on 05/14/2024 by Norleen Lynwood ORN, MD.  Called Nurse Triage reporting Anxiety.  Symptoms began today.  Interventions attempted: Prescription medications: xanax .  Symptoms are: gradually improving since taking prn.  Triage Disposition: See PCP When Office is Open (Within 3 Days)  Patient/caregiver understands and will follow disposition?: Yes, will follow disposition  Copied from CRM #[id number]. Topic: Clinical - Red Word Triage >> May 20, 2024  8:27 AM Antwanette L wrote: Red Word that prompted transfer to Nurse Triage:  Patient is experiencing flare-ups attributed to severe anxiety. Reason for Disposition  MODERATE anxiety (e.g., persistent or frequent anxiety symptoms; interferes with sleep, school, or work)  Answer Assessment - Initial Assessment Questions 1. CONCERN: Did anything happen that prompted you to call today?      I had a flare up today of my anxiety 2. ANXIETY SYMPTOMS: Can you describe how you (your loved one; patient) have been feeling? (e.g., tense, restless, panicky, anxious, keyed up, overwhelmed, sense of impending doom).      Currently feeling good on my medication 3. ONSET: How long have you been feeling this way? (e.g., hours, days, weeks)     Occurred this morning lasted about 30 minutes 4. SEVERITY: How would you rate the level of anxiety? (e.g., 0 - 10; or mild, moderate, severe).     Severe this morning 5. FUNCTIONAL IMPAIRMENT: How have these feelings affected your ability to do daily activities? Have you had more difficulty than usual doing your normal daily activities? (e.g., getting better, same, worse; self-care, school, work, interactions)     Missed work today 6. HISTORY: Have you felt this way before? Have you ever been diagnosed with an anxiety problem in the past? (e.g., generalized anxiety disorder, panic  attacks, PTSD). If Yes, ask: How was this problem treated? (e.g., medicines, counseling, etc.)     Has anxiety, has had these in the past 7. RISK OF HARM - SUICIDAL IDEATION: Do you ever have thoughts of hurting or killing yourself? If Yes, ask:  Do you have these feelings now? Do you have a plan on how you would do this?     Denies  SI/denies HI 8. TREATMENT:  What has been done so far to treat this anxiety? (e.g., medicines, relaxation strategies). What has helped?     Took xanax  today and it helped 9. THERAPIST: Do you have a counselor or therapist? If Yes, ask: What is their name?     Getting ready to find one now, has filled out the paperwork 10. POTENTIAL TRIGGERS: Do you drink caffeinated beverages (e.g., coffee, colas, teas), and how much daily? Do you drink alcohol or use any drugs? Have you started any new medicines recently?       denies 11. PATIENT SUPPORT: Who is with you now? Who do you live with? Do you have family or friends who you can talk to?        Yes good support system at home 12. OTHER SYMPTOMS: Do you have any other symptoms? (e.g., feeling depressed, trouble concentrating, trouble sleeping, trouble breathing, palpitations or fast heartbeat, chest pain, sweating, nausea, or diarrhea)       Denies s/s currently  Pt has paperwork for PCP, also would like doctors note for today.  Protocols used: Anxiety and Panic Attack-A-AH

## 2024-05-20 NOTE — Assessment & Plan Note (Signed)
 Last vitamin D  Lab Results  Component Value Date   VD25OH 19.63 (L) 09/19/2023   Low, to start oral replacement

## 2024-05-20 NOTE — Assessment & Plan Note (Signed)
 BP Readings from Last 3 Encounters:  05/20/24 124/82  05/17/24 128/85  05/14/24 124/82   Stable, pt to continue medical treatment norvasc  10 qd

## 2024-05-20 NOTE — Assessment & Plan Note (Signed)
 Uncontrolled recent worsening, ok for contd tx but FMLA forms signed for work accomodation

## 2024-05-20 NOTE — Patient Instructions (Signed)
 Please continue all other medications as before, and refills have been done if requested.  Please have the pharmacy call with any other refills you may need.  Please continue your efforts at being more active, low cholesterol diet, and weight control.  You are otherwise up to date with prevention measures today.  Please keep your appointments with your specialists as you may have planned  Your work form will be filled out

## 2024-05-23 ENCOUNTER — Encounter: Payer: Self-pay | Admitting: Internal Medicine

## 2024-05-26 ENCOUNTER — Ambulatory Visit

## 2024-06-07 ENCOUNTER — Ambulatory Visit: Admitting: Family Medicine

## 2024-06-07 ENCOUNTER — Encounter: Payer: Self-pay | Admitting: Family Medicine

## 2024-06-07 VITALS — BP 118/80 | HR 83 | Temp 97.7°F | Resp 18 | Ht 72.0 in | Wt 219.0 lb

## 2024-06-07 DIAGNOSIS — F419 Anxiety disorder, unspecified: Secondary | ICD-10-CM

## 2024-06-07 NOTE — Assessment & Plan Note (Signed)
 Worsening symptoms with current Xanax  regimen. Genesight test discussed and consent obtained. - Continue Xanax  1 mg TID PRN for acute anxiety. - Ordered Genesight test for maintenance medication selection. - Follow-up in 4-6 weeks to assess medication response. - Confirmed ongoing care with nurse practitioner.

## 2024-06-07 NOTE — Progress Notes (Signed)
 Assessment & Plan Anxiety Worsening symptoms with current Xanax  regimen. Genesight test discussed and consent obtained. - Continue Xanax  1 mg TID PRN for acute anxiety. - Ordered Genesight test for maintenance medication selection. - Follow-up in 4-6 weeks to assess medication response. - Confirmed ongoing care with nurse practitioner.      Follow up plan: Return for 4-6 weeks with NP since Dr. Norleen is retiring for f/u of anxiety.  Niki Rung, MSN, APRN, FNP-C  Subjective:  HPI: Timothy Fuller is a 57 y.o. male presenting on 06/07/2024 for Anxiety (Hx of Anxiety - seems to have been getting worse over last few months. Episode Fri and again early this AM - tight chest, could not sleep, thoughts racing, vomiting.  Carlis to see therapist on Fri /ADA for job already )  Discussed the use of AI scribe software for clinical note transcription with the patient, who gave verbal consent to proceed.  He has a history of anxiety that has been worsening over the past few months, characterized by episodes of chest tightness, insomnia, racing thoughts, and vomiting, with recent episodes occurring on Friday and early this morning.  He has been using Xanax  1 mg three times a day as needed for anxiety for approximately six to seven years. He has tried other medications in the past, which were ineffective, though he does not remember what they were. Chart review shows Celexa  in 2020.  His anxiety has impacted his work, necessitating the completion of FMLA forms and work notes on multiple occasions.       ROS: Negative unless specifically indicated above in HPI.   Relevant past medical history reviewed and updated as indicated.   Allergies and medications reviewed and updated.   Current Outpatient Medications:    ALPRAZolam  (XANAX ) 1 MG tablet, Take 1 tablet (1 mg total) by mouth 3 (three) times daily as needed., Disp: 90 tablet, Rfl: 2   amLODipine  (NORVASC ) 10 MG tablet, Take 1 tablet (10 mg  total) by mouth daily., Disp: 90 tablet, Rfl: 3   atorvastatin  (LIPITOR) 80 MG tablet, Take 1 tablet (80 mg total) by mouth daily., Disp: 90 tablet, Rfl: 3   fluticasone  (FLONASE ) 50 MCG/ACT nasal spray, Place 2 sprays into both nostrils daily., Disp: 18.2 mL, Rfl: 3   lidocaine (LIDODERM) 5 %, 1 patch daily., Disp: , Rfl:    Multiple Vitamin (MULTIVITAMIN) tablet, Take 1 tablet by mouth daily., Disp: , Rfl:    ondansetron  (ZOFRAN ) 4 MG tablet, Take 1 tablet (4 mg total) by mouth every 6 (six) hours., Disp: 12 tablet, Rfl: 0   oxyCODONE -acetaminophen  (PERCOCET) 10-325 MG tablet, Take 1 tablet by mouth 4 (four) times daily as needed., Disp: , Rfl:    sildenafil  (REVATIO ) 20 MG tablet, TAKE 3-5 TABLETS BY MOUTH AS NEEDED, Disp: 60 tablet, Rfl: 5   traZODone  (DESYREL ) 100 MG tablet, Take 1 tablet (100 mg total) by mouth at bedtime as needed for sleep. (Patient not taking: Reported on 06/07/2024), Disp: 90 tablet, Rfl: 1  Allergies  Allergen Reactions   Penicillin G Diarrhea   Penicillins Nausea Only    Objective:   BP 118/80   Pulse 83   Temp 97.7 F (36.5 C)   Resp 18   Ht 6' (1.829 m)   Wt 219 lb (99.3 kg)   SpO2 98%   BMI 29.70 kg/m    Physical Exam Vitals reviewed.  Constitutional:      General: He is not in acute distress.  Appearance: Normal appearance. He is not ill-appearing, toxic-appearing or diaphoretic.  HENT:     Head: Normocephalic and atraumatic.  Eyes:     General: No scleral icterus.       Right eye: No discharge.        Left eye: No discharge.     Conjunctiva/sclera: Conjunctivae normal.  Cardiovascular:     Rate and Rhythm: Normal rate.  Pulmonary:     Effort: Pulmonary effort is normal. No respiratory distress.  Musculoskeletal:        General: Normal range of motion.     Cervical back: Normal range of motion.  Skin:    General: Skin is warm and dry.  Neurological:     Mental Status: He is alert and oriented to person, place, and time. Mental  status is at baseline.  Psychiatric:        Mood and Affect: Mood normal.        Behavior: Behavior normal.        Thought Content: Thought content normal.        Judgment: Judgment normal.

## 2024-06-10 ENCOUNTER — Telehealth: Payer: Self-pay | Admitting: Internal Medicine

## 2024-06-10 NOTE — Telephone Encounter (Signed)
 Spoke by phone to PA student for [facility] where pt is currently hospitalized for depression with passive SI; stating that going forward they will continue all psychiatric scripts and psychiatric f/u appointments, and likely will begin tapering the xanax .

## 2024-06-16 ENCOUNTER — Ambulatory Visit: Admitting: Behavioral Health
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
## 2024-08-05 ENCOUNTER — Ambulatory Visit: Admitting: Orthopedic Surgery

## 2024-09-15 ENCOUNTER — Ambulatory Visit: Payer: Self-pay | Admitting: Orthopedic Surgery

## 2024-10-04 ENCOUNTER — Ambulatory Visit: Admitting: Family Medicine
